# Patient Record
Sex: Male | Born: 1970 | Race: Black or African American | Hispanic: No | Marital: Single
Health system: Southern US, Community
[De-identification: ages and names within clinical notes are randomized; demographics above are authoritative.]

## PROBLEM LIST (undated history)

## (undated) DIAGNOSIS — R1013 Epigastric pain: Secondary | ICD-10-CM

## (undated) HISTORY — PX: OTHER SURGICAL HISTORY: SHX169

## (undated) HISTORY — DX: Epigastric pain: R10.13

---

## 2018-02-24 ENCOUNTER — Ambulatory Visit (INDEPENDENT_AMBULATORY_CARE_PROVIDER_SITE_OTHER): Payer: Self-pay | Admitting: Internal Medicine

## 2018-02-24 ENCOUNTER — Encounter (INDEPENDENT_AMBULATORY_CARE_PROVIDER_SITE_OTHER): Payer: Self-pay | Admitting: Internal Medicine

## 2018-02-24 ENCOUNTER — Encounter (INDEPENDENT_AMBULATORY_CARE_PROVIDER_SITE_OTHER): Payer: Self-pay | Admitting: *Deleted

## 2018-02-24 VITALS — BP 110/62 | HR 72 | Temp 98.1°F | Ht 73.0 in | Wt 155.6 lb

## 2018-02-24 DIAGNOSIS — R1013 Epigastric pain: Secondary | ICD-10-CM

## 2018-02-24 HISTORY — DX: Epigastric pain: R10.13

## 2018-02-24 MED ORDER — OMEPRAZOLE 40 MG PO CPDR: 40.0000 mg | DELAYED_RELEASE_CAPSULE | Freq: Every day | ORAL | 3 refills | Status: AC

## 2018-02-24 NOTE — Patient Instructions (Signed)
EGD/ US abdomen

## 2018-02-24 NOTE — Progress Notes (Signed)
   Subjective:    Patient ID: Isaiah SailorsLarry Solis, male    DOB: April 11, 1970, 47 y.o.   MRN: [id number]  [facility]. Seen back in September of this year for this. Pain worse when lying down. No vomiting, nausea, melena, diarrhea. Pain worse after eating. Given an Rx which sounds like Carafate to coat his stomach which he says helped. [facility] He says he continues to have the epigastric pain. He presently is not taking anything for his pain. Rates the pain 3/10. The pain is constant.  The pain does get worse after eating. No melena or BRRB. No prior hx of PUD.   Works at [employer].  12/20/2017 H. Pylori negative. H and H 12.5 and 36.7    Review of Systems Past Medical History:  Diagnosis Date  . Abdominal pain, epigastric 02/24/2018    History reviewed. No pertinent surgical history.  Not on File  No current outpatient medications on file prior to visit.   No current facility-administered medications on file prior to visit.         Objective:   Physical Exam Blood pressure 110/62, pulse 72, temperature 98.1 F (36.7 C), height 6\' 1"  (1.854 m), weight 155 lb 9.6 oz (70.6 kg). Alert and oriented. Skin warm and dry. Oral mucosa is moist.   . Sclera anicteric, conjunctivae is pink. Thyroid not enlarged. No cervical lymphadenopathy. Lungs clear. Heart regular rate and rhythm.  Abdomen is soft. Bowel sounds are positive. No hepatomegaly. No abdominal masses felt. No tenderness.  No edema to lower extremities.  .        Assessment & Plan:  Epigastric pain. Will rule out GERD. Will start him on Omeprazole 40mg  daily. Will schedule an EGD Samples of Dexilant given to patient.  US abdomen,.

## 2018-02-26 ENCOUNTER — Ambulatory Visit (HOSPITAL_COMMUNITY)
Admission: RE | Admit: 2018-02-26 | Discharge: 2018-02-26 | Disposition: A | Discharge status: Home / Self Care | Payer: Self-pay | Source: Ambulatory Visit | Attending: Internal Medicine | Admitting: Internal Medicine

## 2018-02-26 DIAGNOSIS — R1013 Epigastric pain: Secondary | ICD-10-CM | POA: Insufficient documentation

## 2018-04-28 ENCOUNTER — Encounter (HOSPITAL_COMMUNITY): Payer: Self-pay | Admitting: *Deleted

## 2018-04-28 ENCOUNTER — Other Ambulatory Visit: Payer: Self-pay

## 2018-04-28 ENCOUNTER — Ambulatory Visit (HOSPITAL_COMMUNITY)
Admission: RE | Admit: 2018-04-28 | Discharge: 2018-04-28 | Disposition: A | Discharge status: Home / Self Care | Payer: Self-pay | Attending: Internal Medicine | Admitting: Internal Medicine

## 2018-04-28 ENCOUNTER — Encounter (HOSPITAL_COMMUNITY)
Admission: RE | Disposition: A | Discharge status: Home / Self Care | Payer: Self-pay | Source: Home / Self Care | Attending: Internal Medicine

## 2018-04-28 ENCOUNTER — Telehealth (INDEPENDENT_AMBULATORY_CARE_PROVIDER_SITE_OTHER): Payer: Self-pay | Admitting: *Deleted

## 2018-04-28 DIAGNOSIS — Z538 Procedure and treatment not carried out for other reasons: Secondary | ICD-10-CM | POA: Insufficient documentation

## 2018-04-28 DIAGNOSIS — R1013 Epigastric pain: Secondary | ICD-10-CM | POA: Insufficient documentation

## 2018-04-28 SURGERY — EGD (ESOPHAGOGASTRODUODENOSCOPY)
Anesthesia: Moderate Sedation

## 2018-04-28 MED ORDER — MEPERIDINE HCL 50 MG/ML IJ SOLN
INTRAMUSCULAR | Status: AC
  Filled 2018-04-28: qty 1

## 2018-04-28 MED ORDER — MIDAZOLAM HCL 5 MG/5ML IJ SOLN
INTRAMUSCULAR | Status: AC
  Filled 2018-04-28: qty 10

## 2018-04-28 MED ORDER — LIDOCAINE VISCOUS HCL 2 % MT SOLN
OROMUCOSAL | Status: AC
  Filled 2018-04-28: qty 15

## 2018-04-28 MED ORDER — SODIUM CHLORIDE 0.9 % IV SOLN
INTRAVENOUS | Status: DC
  Administered 2018-04-28: 1000 mL via INTRAVENOUS

## 2018-04-28 NOTE — OR Nursing (Signed)
Patient in for an EGD. Patient told Isaiah Solis at the registration desk that he did not have anybody here with him. We informed the patient that we could not perform the procedure unless he had a person here in the hospital. Patient called several people and got his sister to come. When the sister arrived, she stated, "I don't even know why I am here nobody has explained anything to me. I don't like being in hospitals" Sister was taken to the patient's pre-op room. After about 5 minutes, the patient came out of the room and had taken his IV out and removed all electrodes and put on clothes and stated, "I just want to go. I don't want to have it done." Patients sister was upset stating, "I work and can't miss work, I thought I was just coming to pick him up." Explained to patient's sister that there has to be a responsible adult in the building when we perform the procedure due to sedation. Offered to apply dressing to IV site and patient refused, offered to have Dr. Karilyn Cota come and talk to patient and he refused and offered to get procedure rescheduled for a more convenient time and patient refused. Dr. Karilyn Cota and Isaiah Solis at the office notified of the above.

## 2018-04-28 NOTE — Telephone Encounter (Signed)
Per endo patient showed up to procedure with out a driver, they explained he needed driver and they couldn't do procedure until he had a driver, he proceeded to give the false phone numbers, again they explained he needed a driver, he finally got his sister to come, after they got him back, the sister began to fuss at him, he yanked out his IV came out of room dressed and stated he was leaving, he wasn't doing procedure and he wasn't reschduling

## 2018-04-29 NOTE — Telephone Encounter (Signed)
noted 

## 2020-10-26 ENCOUNTER — Inpatient Hospital Stay (HOSPITAL_COMMUNITY): Payer: Managed Care, Other (non HMO)

## 2020-10-26 ENCOUNTER — Emergency Department (HOSPITAL_COMMUNITY): Payer: Managed Care, Other (non HMO)

## 2020-10-26 ENCOUNTER — Inpatient Hospital Stay (HOSPITAL_COMMUNITY)
Admission: EM | Admit: 2020-10-26 | Discharge: 2020-11-21 | DRG: 004 | Disposition: A | Discharge status: Other Acute Inpatient Hospital | Payer: Managed Care, Other (non HMO) | Attending: Surgery | Admitting: Surgery

## 2020-10-26 ENCOUNTER — Other Ambulatory Visit: Payer: Self-pay

## 2020-10-26 DIAGNOSIS — S36039A Unspecified laceration of spleen, initial encounter: Secondary | ICD-10-CM | POA: Diagnosis present

## 2020-10-26 DIAGNOSIS — Z66 Do not resuscitate: Secondary | ICD-10-CM | POA: Diagnosis not present

## 2020-10-26 DIAGNOSIS — G9600 Cerebrospinal fluid leak, unspecified: Secondary | ICD-10-CM | POA: Diagnosis present

## 2020-10-26 DIAGNOSIS — S0285XA Fracture of orbit, unspecified, initial encounter for closed fracture: Secondary | ICD-10-CM | POA: Diagnosis present

## 2020-10-26 DIAGNOSIS — Z20822 Contact with and (suspected) exposure to covid-19: Secondary | ICD-10-CM | POA: Diagnosis present

## 2020-10-26 DIAGNOSIS — S069X1A Unspecified intracranial injury with loss of consciousness of 30 minutes or less, initial encounter: Secondary | ICD-10-CM | POA: Diagnosis not present

## 2020-10-26 DIAGNOSIS — J9811 Atelectasis: Secondary | ICD-10-CM | POA: Diagnosis not present

## 2020-10-26 DIAGNOSIS — J95811 Postprocedural pneumothorax: Secondary | ICD-10-CM

## 2020-10-26 DIAGNOSIS — J9690 Respiratory failure, unspecified, unspecified whether with hypoxia or hypercapnia: Secondary | ICD-10-CM | POA: Diagnosis present

## 2020-10-26 DIAGNOSIS — T1490XA Injury, unspecified, initial encounter: Secondary | ICD-10-CM | POA: Diagnosis present

## 2020-10-26 DIAGNOSIS — S06369A Traumatic hemorrhage of cerebrum, unspecified, with loss of consciousness of unspecified duration, initial encounter: Secondary | ICD-10-CM | POA: Diagnosis present

## 2020-10-26 DIAGNOSIS — H5704 Mydriasis: Secondary | ICD-10-CM | POA: Diagnosis present

## 2020-10-26 DIAGNOSIS — R6521 Severe sepsis with septic shock: Secondary | ICD-10-CM | POA: Diagnosis not present

## 2020-10-26 DIAGNOSIS — S270XXA Traumatic pneumothorax, initial encounter: Secondary | ICD-10-CM | POA: Diagnosis present

## 2020-10-26 DIAGNOSIS — Z7189 Other specified counseling: Secondary | ICD-10-CM

## 2020-10-26 DIAGNOSIS — S020XXB Fracture of vault of skull, initial encounter for open fracture: Secondary | ICD-10-CM | POA: Diagnosis present

## 2020-10-26 DIAGNOSIS — J9621 Acute and chronic respiratory failure with hypoxia: Secondary | ICD-10-CM | POA: Diagnosis not present

## 2020-10-26 DIAGNOSIS — G9389 Other specified disorders of brain: Secondary | ICD-10-CM | POA: Diagnosis present

## 2020-10-26 DIAGNOSIS — E870 Hyperosmolality and hypernatremia: Secondary | ICD-10-CM | POA: Diagnosis not present

## 2020-10-26 DIAGNOSIS — Z9689 Presence of other specified functional implants: Secondary | ICD-10-CM

## 2020-10-26 DIAGNOSIS — J189 Pneumonia, unspecified organism: Secondary | ICD-10-CM | POA: Diagnosis not present

## 2020-10-26 DIAGNOSIS — L8990 Pressure ulcer of unspecified site, unspecified stage: Secondary | ICD-10-CM | POA: Insufficient documentation

## 2020-10-26 DIAGNOSIS — Z930 Tracheostomy status: Secondary | ICD-10-CM

## 2020-10-26 DIAGNOSIS — Y92410 Unspecified street and highway as the place of occurrence of the external cause: Secondary | ICD-10-CM | POA: Diagnosis not present

## 2020-10-26 DIAGNOSIS — S12100A Unspecified displaced fracture of second cervical vertebra, initial encounter for closed fracture: Secondary | ICD-10-CM | POA: Diagnosis present

## 2020-10-26 DIAGNOSIS — Z515 Encounter for palliative care: Secondary | ICD-10-CM

## 2020-10-26 DIAGNOSIS — S40812A Abrasion of left upper arm, initial encounter: Secondary | ICD-10-CM | POA: Diagnosis present

## 2020-10-26 DIAGNOSIS — Z9911 Dependence on respirator [ventilator] status: Secondary | ICD-10-CM | POA: Diagnosis not present

## 2020-10-26 DIAGNOSIS — Z978 Presence of other specified devices: Secondary | ICD-10-CM

## 2020-10-26 DIAGNOSIS — I609 Nontraumatic subarachnoid hemorrhage, unspecified: Secondary | ICD-10-CM

## 2020-10-26 HISTORY — PX: IR US GUIDE VASC ACCESS RIGHT: IMG2390

## 2020-10-26 HISTORY — PX: IR ANGIOGRAM VISCERAL SELECTIVE: IMG657

## 2020-10-26 HISTORY — PX: IR EMBO ART  VEN HEMORR LYMPH EXTRAV  INC GUIDE ROADMAPPING: IMG5450

## 2020-10-26 LAB — URINALYSIS, ROUTINE W REFLEX MICROSCOPIC
Bacteria, UA: NONE SEEN
Bilirubin Urine: NEGATIVE
Bilirubin Urine: NEGATIVE
Glucose, UA: NEGATIVE mg/dL
Glucose, UA: NEGATIVE mg/dL
Ketones, ur: NEGATIVE mg/dL
Ketones, ur: NEGATIVE mg/dL
Leukocytes,Ua: NEGATIVE
Nitrite: NEGATIVE
Nitrite: NEGATIVE
Protein, ur: NEGATIVE mg/dL
Protein, ur: 30 mg/dL — AB
Specific Gravity, Urine: >1.046 — ABNORMAL HIGH (ref 1.005–1.030)
Specific Gravity, Urine: 1.028 (ref 1.005–1.030)
WBC, UA: >50 WBC/hpf — ABNORMAL HIGH (ref 0–5)
pH: 5 (ref 5.0–8.0)
pH: 5 (ref 5.0–8.0)

## 2020-10-26 LAB — I-STAT ARTERIAL BLOOD GAS, ED
Acid-base deficit: 1 mmol/L (ref 0.0–2.0)
Bicarbonate: 25 mmol/L (ref 20.0–28.0)
Calcium, Ion: 1.18 mmol/L (ref 1.15–1.40)
HCT: 27 % — ABNORMAL LOW (ref 39.0–52.0)
Hemoglobin: 9.2 g/dL — ABNORMAL LOW (ref 13.0–17.0)
O2 Saturation: 100 %
Patient temperature: 97.6
Potassium: 3 mmol/L — ABNORMAL LOW (ref 3.5–5.1)
Sodium: 140 mmol/L (ref 135–145)
TCO2: 26 mmol/L (ref 22–32)
pCO2 arterial: 45.8 mmHg (ref 32.0–48.0)
pH, Arterial: 7.342 — ABNORMAL LOW (ref 7.350–7.450)
pO2, Arterial: 342 mmHg — ABNORMAL HIGH (ref 83.0–108.0)

## 2020-10-26 LAB — BASIC METABOLIC PANEL
Anion gap: 11 (ref 5–15)
BUN: 13 mg/dL (ref 6–20)
CO2: 21 mmol/L — ABNORMAL LOW (ref 22–32)
Calcium: 8.6 mg/dL — ABNORMAL LOW (ref 8.9–10.3)
Chloride: 107 mmol/L (ref 98–111)
Creatinine, Ser: 1.05 mg/dL (ref 0.61–1.24)
GFR, Estimated: >60 mL/min (ref >60)
Glucose, Bld: 119 mg/dL — ABNORMAL HIGH (ref 70–99)
Potassium: 3.5 mmol/L (ref 3.5–5.1)
Sodium: 139 mmol/L (ref 135–145)

## 2020-10-26 LAB — COMPREHENSIVE METABOLIC PANEL
ALT: 24 U/L (ref 0–44)
AST: 47 U/L — ABNORMAL HIGH (ref 15–41)
Albumin: 3.4 g/dL — ABNORMAL LOW (ref 3.5–5.0)
Alkaline Phosphatase: 56 U/L (ref 38–126)
Anion gap: 9 (ref 5–15)
BUN: 14 mg/dL (ref 6–20)
CO2: 21 mmol/L — ABNORMAL LOW (ref 22–32)
Calcium: 8.4 mg/dL — ABNORMAL LOW (ref 8.9–10.3)
Chloride: 108 mmol/L (ref 98–111)
Creatinine, Ser: 1.18 mg/dL (ref 0.61–1.24)
GFR, Estimated: 42 mL/min — ABNORMAL LOW (ref >60)
Glucose, Bld: 161 mg/dL — ABNORMAL HIGH (ref 70–99)
Potassium: 3.2 mmol/L — ABNORMAL LOW (ref 3.5–5.1)
Sodium: 138 mmol/L (ref 135–145)
Total Bilirubin: 0.6 mg/dL (ref 0.3–1.2)
Total Protein: 5.8 g/dL — ABNORMAL LOW (ref 6.5–8.1)

## 2020-10-26 LAB — CBC
HCT: 29.1 % — ABNORMAL LOW (ref 39.0–52.0)
HCT: 31.1 % — ABNORMAL LOW (ref 39.0–52.0)
HCT: 32.1 % — ABNORMAL LOW (ref 39.0–52.0)
HCT: 32.7 % — ABNORMAL LOW (ref 39.0–52.0)
Hemoglobin: 10.1 g/dL — ABNORMAL LOW (ref 13.0–17.0)
Hemoglobin: 10.3 g/dL — ABNORMAL LOW (ref 13.0–17.0)
Hemoglobin: 10.6 g/dL — ABNORMAL LOW (ref 13.0–17.0)
Hemoglobin: 9.8 g/dL — ABNORMAL LOW (ref 13.0–17.0)
MCH: 27.4 pg (ref 26.0–34.0)
MCH: 28.1 pg (ref 26.0–34.0)
MCH: 28.1 pg (ref 26.0–34.0)
MCH: 28.2 pg (ref 26.0–34.0)
MCHC: 31.5 g/dL (ref 30.0–36.0)
MCHC: 32.4 g/dL (ref 30.0–36.0)
MCHC: 33.1 g/dL (ref 30.0–36.0)
MCHC: 33.7 g/dL (ref 30.0–36.0)
MCV: 83.6 fL (ref 80.0–100.0)
MCV: 85 fL (ref 80.0–100.0)
MCV: 86.7 fL (ref 80.0–100.0)
MCV: 87.2 fL (ref 80.0–100.0)
Platelets: 194 10*3/uL (ref 150–400)
Platelets: 229 10*3/uL (ref 150–400)
Platelets: 234 10*3/uL (ref 150–400)
Platelets: 260 10*3/uL (ref 150–400)
RBC: 3.48 MIL/uL — ABNORMAL LOW (ref 4.22–5.81)
RBC: 3.66 MIL/uL — ABNORMAL LOW (ref 4.22–5.81)
RBC: 3.68 MIL/uL — ABNORMAL LOW (ref 4.22–5.81)
RBC: 3.77 MIL/uL — ABNORMAL LOW (ref 4.22–5.81)
RDW: 13.9 % (ref 11.5–15.5)
RDW: 14.1 % (ref 11.5–15.5)
RDW: 14.2 % (ref 11.5–15.5)
RDW: 14.2 % (ref 11.5–15.5)
WBC: 16.2 10*3/uL — ABNORMAL HIGH (ref 4.0–10.5)
WBC: 17.2 10*3/uL — ABNORMAL HIGH (ref 4.0–10.5)
WBC: 19.9 10*3/uL — ABNORMAL HIGH (ref 4.0–10.5)
WBC: 6.2 10*3/uL (ref 4.0–10.5)
nRBC: 0 % (ref 0.0–0.2)
nRBC: 0 % (ref 0.0–0.2)
nRBC: 0 % (ref 0.0–0.2)
nRBC: 0 % (ref 0.0–0.2)

## 2020-10-26 LAB — I-STAT CHEM 8, ED
BUN: 14 mg/dL (ref 6–20)
Calcium, Ion: 1.06 mmol/L — ABNORMAL LOW (ref 1.15–1.40)
Chloride: 106 mmol/L (ref 98–111)
Creatinine, Ser: 1.3 mg/dL — ABNORMAL HIGH (ref 0.61–1.24)
Glucose, Bld: 157 mg/dL — ABNORMAL HIGH (ref 70–99)
HCT: 32 % — ABNORMAL LOW (ref 39.0–52.0)
Hemoglobin: 10.9 g/dL — ABNORMAL LOW (ref 13.0–17.0)
Potassium: 3.2 mmol/L — ABNORMAL LOW (ref 3.5–5.1)
Sodium: 141 mmol/L (ref 135–145)
TCO2: 22 mmol/L (ref 22–32)

## 2020-10-26 LAB — SAMPLE TO BLOOD BANK

## 2020-10-26 LAB — PROTIME-INR
INR: 1.2 (ref 0.8–1.2)
Prothrombin Time: 15.1 seconds (ref 11.4–15.2)

## 2020-10-26 LAB — SODIUM
Sodium: 140 mmol/L (ref 135–145)
Sodium: 141 mmol/L (ref 135–145)

## 2020-10-26 LAB — LACTIC ACID, PLASMA: Lactic Acid, Venous: 2.7 mmol/L (ref 0.5–1.9)

## 2020-10-26 LAB — TRAUMA TEG PANEL
CFF Max Amplitude: 21.9 mm (ref 15–32)
Citrated Kaolin (R): 4.7 min (ref 4.6–9.1)
Citrated Rapid TEG (MA): 61.4 mm (ref 52–70)
Lysis at 30 Minutes: 0.6 % (ref 0.0–2.6)

## 2020-10-26 LAB — ETHANOL: Alcohol, Ethyl (B): 171 mg/dL — ABNORMAL HIGH (ref <10)

## 2020-10-26 LAB — HIV ANTIBODY (ROUTINE TESTING W REFLEX): HIV Screen 4th Generation wRfx: NONREACTIVE

## 2020-10-26 LAB — RESP PANEL BY RT-PCR (FLU A&B, COVID) ARPGX2
Influenza A by PCR: NEGATIVE
Influenza B by PCR: NEGATIVE
SARS Coronavirus 2 by RT PCR: NEGATIVE

## 2020-10-26 MED ORDER — SODIUM CHLORIDE 0.9 % IV SOLN
INTRAVENOUS | Status: DC | PRN
  Administered 2020-10-26 – 2020-11-03 (×2): 1000 mL via INTRAVENOUS
  Administered 2020-11-07: 500 mL via INTRAVENOUS

## 2020-10-26 MED ORDER — FENTANYL CITRATE (PF) 100 MCG/2ML IJ SOLN
50.0000 ug | INTRAMUSCULAR | Status: DC | PRN
  Administered 2020-10-27 – 2020-10-29 (×8): 100 ug via INTRAVENOUS
  Administered 2020-10-29: 200 ug via INTRAVENOUS
  Administered 2020-10-30: 100 ug via INTRAVENOUS
  Administered 2020-10-30: 200 ug via INTRAVENOUS
  Administered 2020-10-30: 100 ug via INTRAVENOUS
  Administered 2020-10-30: 200 ug via INTRAVENOUS
  Administered 2020-10-30 – 2020-10-31 (×3): 100 ug via INTRAVENOUS
  Administered 2020-10-31: 50 ug via INTRAVENOUS
  Administered 2020-10-31 – 2020-11-01 (×3): 100 ug via INTRAVENOUS
  Administered 2020-11-01: 50 ug via INTRAVENOUS
  Administered 2020-11-01 – 2020-11-05 (×22): 100 ug via INTRAVENOUS
  Administered 2020-11-05: 200 ug via INTRAVENOUS
  Administered 2020-11-05: 100 ug via INTRAVENOUS
  Administered 2020-11-05: 50 ug via INTRAVENOUS
  Administered 2020-11-05: 100 ug via INTRAVENOUS
  Administered 2020-11-05 – 2020-11-08 (×5): 200 ug via INTRAVENOUS
  Administered 2020-11-08 – 2020-11-09 (×3): 100 ug via INTRAVENOUS
  Administered 2020-11-09: 200 ug via INTRAVENOUS
  Administered 2020-11-09 – 2020-11-12 (×9): 100 ug via INTRAVENOUS
  Administered 2020-11-12: 50 ug via INTRAVENOUS
  Administered 2020-11-12 – 2020-11-18 (×8): 100 ug via INTRAVENOUS
  Filled 2020-10-26: qty 2
  Filled 2020-10-26: qty 4
  Filled 2020-10-26: qty 2
  Filled 2020-10-26: qty 4
  Filled 2020-10-26 (×11): qty 2
  Filled 2020-10-26: qty 4
  Filled 2020-10-26 (×3): qty 2
  Filled 2020-10-26: qty 4
  Filled 2020-10-26 (×13): qty 2
  Filled 2020-10-26: qty 4
  Filled 2020-10-26 (×9): qty 2
  Filled 2020-10-26: qty 4
  Filled 2020-10-26 (×8): qty 2
  Filled 2020-10-26: qty 4
  Filled 2020-10-26 (×7): qty 2
  Filled 2020-10-26: qty 4
  Filled 2020-10-26 (×11): qty 2
  Filled 2020-10-26: qty 4
  Filled 2020-10-26: qty 2

## 2020-10-26 MED ORDER — ACETAMINOPHEN 160 MG/5ML PO SOLN: 650.0000 mg | Freq: Four times a day (QID) | ORAL | Status: DC | PRN

## 2020-10-26 MED ORDER — SODIUM CHLORIDE 0.9 % IV SOLN
2.0000 g | Freq: Two times a day (BID) | INTRAVENOUS | Status: DC
  Administered 2020-10-26 – 2020-11-01 (×14): 2 g via INTRAVENOUS
  Filled 2020-10-26: qty 2
  Filled 2020-10-26: qty 20
  Filled 2020-10-26 (×11): qty 2
  Filled 2020-10-26: qty 20
  Filled 2020-10-26: qty 2

## 2020-10-26 MED ORDER — ONDANSETRON 4 MG PO TBDP: 4.0000 mg | ORAL_TABLET | Freq: Four times a day (QID) | ORAL | Status: DC | PRN

## 2020-10-26 MED ORDER — LIDOCAINE HCL (PF) 1 % IJ SOLN
INTRAMUSCULAR | Status: AC
  Filled 2020-10-26: qty 30

## 2020-10-26 MED ORDER — IOHEXOL 300 MG/ML  SOLN
50.0000 mL | Freq: Once | INTRAMUSCULAR | Status: AC | PRN
  Administered 2020-10-26: 20 mL via INTRA_ARTERIAL

## 2020-10-26 MED ORDER — VANCOMYCIN HCL 1250 MG/250ML IV SOLN
1250.0000 mg | Freq: Once | INTRAVENOUS | Status: AC
  Administered 2020-10-26: 1250 mg via INTRAVENOUS
  Filled 2020-10-26: qty 250

## 2020-10-26 MED ORDER — LACTATED RINGERS IV SOLN: INTRAVENOUS | Status: DC

## 2020-10-26 MED ORDER — CHLORHEXIDINE GLUCONATE 0.12% ORAL RINSE (MEDLINE KIT)
15.0000 mL | Freq: Two times a day (BID) | OROMUCOSAL | Status: DC
  Administered 2020-10-26 – 2020-11-21 (×53): 15 mL via OROMUCOSAL

## 2020-10-26 MED ORDER — ONDANSETRON HCL 4 MG/2ML IJ SOLN: 4.0000 mg | Freq: Four times a day (QID) | INTRAMUSCULAR | Status: DC | PRN

## 2020-10-26 MED ORDER — IOHEXOL 350 MG/ML SOLN
100.0000 mL | Freq: Once | INTRAVENOUS | Status: AC | PRN
  Administered 2020-10-26: 100 mL via INTRAVENOUS

## 2020-10-26 MED ORDER — SODIUM CHLORIDE 3 % IV SOLN
INTRAVENOUS | Status: DC
  Filled 2020-10-26 (×3): qty 500

## 2020-10-26 MED ORDER — CHLORHEXIDINE GLUCONATE CLOTH 2 % EX PADS
6.0000 | MEDICATED_PAD | Freq: Every day | CUTANEOUS | Status: DC
  Administered 2020-10-26 – 2020-11-20 (×26): 6 via TOPICAL

## 2020-10-26 MED ORDER — MIDAZOLAM HCL 2 MG/2ML IJ SOLN
INTRAMUSCULAR | Status: AC
  Filled 2020-10-26: qty 4

## 2020-10-26 MED ORDER — DOCUSATE SODIUM 100 MG PO CAPS: 100.0000 mg | ORAL_CAPSULE | Freq: Two times a day (BID) | ORAL | Status: DC

## 2020-10-26 MED ORDER — MIDAZOLAM HCL 2 MG/2ML IJ SOLN: 2.0000 mg | INTRAMUSCULAR | Status: DC | PRN

## 2020-10-26 MED ORDER — FENTANYL CITRATE (PF) 100 MCG/2ML IJ SOLN
50.0000 ug | INTRAMUSCULAR | Status: DC | PRN
  Administered 2020-11-01 – 2020-11-09 (×2): 50 ug via INTRAVENOUS
  Filled 2020-10-26 (×2): qty 2

## 2020-10-26 MED ORDER — IOHEXOL 300 MG/ML  SOLN: 50.0000 mL | Freq: Once | INTRAMUSCULAR | Status: DC | PRN

## 2020-10-26 MED ORDER — ORAL CARE MOUTH RINSE
15.0000 mL | OROMUCOSAL | Status: DC
  Administered 2020-10-26 – 2020-11-21 (×264): 15 mL via OROMUCOSAL

## 2020-10-26 MED ORDER — HYDRALAZINE HCL 20 MG/ML IJ SOLN: 10.0000 mg | INTRAMUSCULAR | Status: DC | PRN

## 2020-10-26 MED ORDER — DOCUSATE SODIUM 50 MG/5ML PO LIQD
100.0000 mg | Freq: Two times a day (BID) | ORAL | Status: DC
  Administered 2020-10-26 – 2020-11-21 (×30): 100 mg
  Filled 2020-10-26 (×31): qty 10

## 2020-10-26 MED ORDER — PANTOPRAZOLE SODIUM 40 MG IV SOLR
40.0000 mg | Freq: Every day | INTRAVENOUS | Status: DC
Start: 2020-10-26 — End: 2020-11-06
  Administered 2020-10-26 – 2020-11-06 (×12): 40 mg via INTRAVENOUS
  Filled 2020-10-26 (×12): qty 40

## 2020-10-26 MED ORDER — FENTANYL CITRATE (PF) 100 MCG/2ML IJ SOLN
INTRAMUSCULAR | Status: AC
  Filled 2020-10-26: qty 4

## 2020-10-26 MED ORDER — ACETAMINOPHEN 500 MG PO TABS
1000.0000 mg | ORAL_TABLET | Freq: Four times a day (QID) | ORAL | Status: DC
  Administered 2020-10-26 – 2020-11-21 (×95): 1000 mg
  Filled 2020-10-26 (×100): qty 2

## 2020-10-26 MED ORDER — VANCOMYCIN HCL 750 MG/150ML IV SOLN
750.0000 mg | Freq: Two times a day (BID) | INTRAVENOUS | Status: DC
  Administered 2020-10-26 – 2020-10-30 (×8): 750 mg via INTRAVENOUS
  Filled 2020-10-26 (×8): qty 150

## 2020-10-26 MED ORDER — BISACODYL 10 MG RE SUPP: 10.0000 mg | Freq: Every day | RECTAL | Status: DC | PRN

## 2020-10-26 MED ORDER — IOHEXOL 300 MG/ML  SOLN
50.0000 mL | Freq: Once | INTRAMUSCULAR | Status: AC | PRN
  Administered 2020-10-26: 25 mL via INTRA_ARTERIAL

## 2020-10-26 MED ORDER — LEVETIRACETAM IN NACL 500 MG/100ML IV SOLN
500.0000 mg | Freq: Two times a day (BID) | INTRAVENOUS | Status: DC
  Administered 2020-10-26 – 2020-11-06 (×23): 500 mg via INTRAVENOUS
  Filled 2020-10-26 (×23): qty 100

## 2020-10-26 MED ORDER — POLYETHYLENE GLYCOL 3350 17 G PO PACK
17.0000 g | PACK | Freq: Every day | ORAL | Status: DC
  Administered 2020-10-26 – 2020-11-21 (×14): 17 g
  Filled 2020-10-26 (×14): qty 1

## 2020-10-26 MED ORDER — PANTOPRAZOLE SODIUM 40 MG PO TBEC: 40.0000 mg | DELAYED_RELEASE_TABLET | Freq: Every day | ORAL | Status: DC

## 2020-10-26 NOTE — Progress Notes (Signed)
EEG complete - results pending 
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
## 2020-10-26 NOTE — Progress Notes (Signed)
Patient seen and examined. Remains off continuous analgesia/sedation and has not received andy PRN doses of either. No response to painful stimulus for me. Anisocoria L>R and L nonreactive. Admission CT imaging reviewed. EEG ordered, no sz activity. Repeat CT head this AM with slightly increased MLS, and again on repeat CT this PM. Patient now febrile, suspect early onset of neurostorming. Tylenol ordered, pancx sent. D/w NSGY, Dr. Jake Samples, and plan to assess neuro status over the next 24h. Clinical update provided to three of the patient's four daughters and one of two sisters via phone. Discussion held jointly with Palliative PA, Lamarr Lulas. Clarified that the patient is unmarried and all four children are over the age of 29. Explained decision-making rights per [place] law and all verbalized understanding. Communicated patient's extremely guarded prognosis and suspicion for significant brain injury that would result in  most likely scenario of need for care in a facility for needs and vent dependency long-term. Discussed plan per NSGY to assess for improvement over the next 24h and will re-convene with all family 8/7 at 1400 for another discussion.   Critical care time:  Diamantina Monks, MD General and Trauma Surgery [facility] Surgery
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
## 2020-10-26 NOTE — ED Notes (Signed)
Back to traumaB at this time.

## 2020-10-26 NOTE — TOC CAGE-AID Note (Signed)
Transition of Care [facility]) - CAGE-AID Screening   Patient Details  Name: Isaiah Solis MRN: [id number] Date of Birth: 05-11-1970   Hewitt Shorts, RN Trauma Response Nurse Phone Number: [phone] 10/26/2020, 7:30 AM   Clinical Narrative:  Pt currently is intubated.    CAGE-AID Screening: Substance Abuse Screening unable to be completed due to: : Patient unable to participate (pt currently is intubated in ICU)
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
## 2020-10-26 NOTE — Progress Notes (Signed)
RT transported pt to CT3 and back to 4N20 w/ no complications. RT will cont to monitor.
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
## 2020-10-27 LAB — BASIC METABOLIC PANEL
Anion gap: 6 (ref 5–15)
BUN: 17 mg/dL (ref 6–20)
CO2: 25 mmol/L (ref 22–32)
Calcium: 8.7 mg/dL — ABNORMAL LOW (ref 8.9–10.3)
Chloride: 111 mmol/L (ref 98–111)
Creatinine, Ser: 0.81 mg/dL (ref 0.61–1.24)
GFR, Estimated: >60 mL/min (ref >60)
Glucose, Bld: 138 mg/dL — ABNORMAL HIGH (ref 70–99)
Potassium: 3.7 mmol/L (ref 3.5–5.1)
Sodium: 142 mmol/L (ref 135–145)

## 2020-10-27 LAB — SODIUM
Sodium: 143 mmol/L (ref 135–145)
Sodium: 148 mmol/L — ABNORMAL HIGH (ref 135–145)

## 2020-10-27 MED ORDER — SODIUM CHLORIDE 3 % IV SOLN
INTRAVENOUS | Status: DC
  Filled 2020-10-27 (×3): qty 500
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
## 2020-10-28 DIAGNOSIS — S069X6A Unspecified intracranial injury with loss of consciousness greater than 24 hours without return to pre-existing conscious level with patient surviving, initial encounter: Secondary | ICD-10-CM

## 2020-10-28 LAB — BASIC METABOLIC PANEL
Anion gap: 6 (ref 5–15)
BUN: 19 mg/dL (ref 6–20)
CO2: 23 mmol/L (ref 22–32)
Calcium: 8.4 mg/dL — ABNORMAL LOW (ref 8.9–10.3)
Chloride: 121 mmol/L — ABNORMAL HIGH (ref 98–111)
Creatinine, Ser: 0.85 mg/dL (ref 0.61–1.24)
GFR, Estimated: >60 mL/min (ref >60)
Glucose, Bld: 115 mg/dL — ABNORMAL HIGH (ref 70–99)
Potassium: 3.5 mmol/L (ref 3.5–5.1)
Sodium: 150 mmol/L — ABNORMAL HIGH (ref 135–145)

## 2020-10-28 LAB — SODIUM
Sodium: 152 mmol/L — ABNORMAL HIGH (ref 135–145)
Sodium: 152 mmol/L — ABNORMAL HIGH (ref 135–145)

## 2020-10-28 NOTE — Progress Notes (Signed)
Na+ 150 from AM labs. Dr. Maurice Small notified and discontinued 3%.
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
## 2020-10-28 NOTE — Progress Notes (Signed)
   10/28/20 1505  Clinical Encounter Type  Visited With Patient and family together  Visit Type Spiritual support  Referral From Nurse  Consult/Referral To Chaplain  Spiritual Encounters  Spiritual Needs Emotional  Chaplain received a page that the family for Mr. Isaiah Solis received some critical news on his prognosis.  Introduced myself to the family and informed them I am here for support and any way I can help please let myself and the nurse know.  Prayer was not requested at this time.
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
## 2020-10-29 ENCOUNTER — Inpatient Hospital Stay (HOSPITAL_COMMUNITY): Payer: Managed Care, Other (non HMO)

## 2020-10-29 DIAGNOSIS — Z66 Do not resuscitate: Secondary | ICD-10-CM

## 2020-10-29 DIAGNOSIS — S069X9A Unspecified intracranial injury with loss of consciousness of unspecified duration, initial encounter: Secondary | ICD-10-CM

## 2020-10-29 LAB — CULTURE, RESPIRATORY W GRAM STAIN: Culture: NORMAL

## 2020-10-29 LAB — SODIUM
Sodium: 149 mmol/L — ABNORMAL HIGH (ref 135–145)
Sodium: 152 mmol/L — ABNORMAL HIGH (ref 135–145)
Sodium: 151 mmol/L — ABNORMAL HIGH (ref 135–145)
Sodium: 151 mmol/L — ABNORMAL HIGH (ref 135–145)

## 2020-10-29 LAB — MRSA NEXT GEN BY PCR, NASAL: MRSA by PCR Next Gen: NOT DETECTED

## 2020-10-29 LAB — GLUCOSE, CAPILLARY
Glucose-Capillary: 95 mg/dL (ref 70–99)
Glucose-Capillary: 99 mg/dL (ref 70–99)

## 2020-10-29 MED ORDER — PROSOURCE TF PO LIQD: 45.0000 mL | Freq: Two times a day (BID) | ORAL | Status: DC

## 2020-10-29 MED ORDER — PIVOT 1.5 CAL PO LIQD
1000.0000 mL | ORAL | Status: DC
  Administered 2020-10-29 – 2020-11-04 (×7): 1000 mL
  Filled 2020-10-29: qty 1000

## 2020-10-29 MED ORDER — VITAL HIGH PROTEIN PO LIQD: 1000.0000 mL | ORAL | Status: DC

## 2020-10-29 NOTE — Progress Notes (Signed)
This chaplain is responding to PMT consult for prayer. This chaplain is appreciative of Chaplain Vincella's prayer on Sunday.    The chaplain understands from communicating with the Pt. RN-Susannah, family has not been present bedside today.  The chaplain offered F/U spiritual care as needed through the spiritual care team and PMT, [phone].  The chaplain will continue intercessory prayer.

## 2020-10-29 NOTE — Progress Notes (Signed)
MRI brain reviewed. See report below COMPARISON:  Head CT October 26, 2020  FINDINGS: Brain: Intraparenchymal hemorrhage centered in the right basal ganglia with surrounding vasogenic edema measuring approximately 2.7 x 2.6 cm causing mass effect on the right lateral ventricle with face 6 mm leftward midline shift. Confluent foci of intraparenchymal hemorrhage in the posterior right temporal lobe with surrounding vasogenic edema extending into the right occipital lobe. The largest focus measures approximately 3.0 x 1.9 cm. The vasogenic edema surrounding the intraparenchymal hemorrhages appear progressed from prior CT. Intraventricular hemorrhage seen layering the occipital horn of the left lateral ventricle and small foci of subarachnoid hemorrhage are seen scattered in the bilateral cerebral convexity. No hydrocephalus. Foci of restricted diffusion are seen in the right corona radiata, splenium of the corpus callosum, scattered in the bilateral frontal cortex and in the midbrain. Foci of susceptibility artifact are seen in the deep white matter of the left frontal lobe and along the left fornix. Vascular: Normal flow voids. Skull and upper cervical spine: Left frontal orbital fracture. For details on skull fractures, please refer to prior head CT. Sinuses/Orbits: Fluid level within the paranasal sinuses and nasopharynx likely related to the presence of endotracheal tube. A a the orbits are maintained. Other: None. IMPRESSION: 1. Right basal ganglia intraparenchymal hemorrhage and confluent foci of right temporal intraparenchymal hemorrhages with progressed surrounding vasogenic edema resulting approximately 6 mm leftward midline shift. 2. Findings consistent with diffuse axonal injury with susceptibility artifacts and foci of restricted diffusion within the bilateral frontal lobes, splenium of corpus callosum, left fornix and midbrain, as detailed above.   Given these findings,  I called back Ms. Arlee Muslim (daughter), along with the patient's bedside RN on the speaker phone call-and expained the imaging findings.  The MRI confirms the CT findings as well as shows additional evidence of shearing injury as seen in TBI and worsening intraparenchymal hemorrhages.  Crux of the conversation: Given the exam as documented by Dr. Amada Jupiter and by neurosurgery as well as imaging findings, chances of neurologically meaningful recovery for independent living are very poor. Daughter expressed understanding.  I gave her the opportunity of asking me questions which she did and answered them to the best of my ability.  Please call neurology with any further questions if we can be of assistance.  -- Milon Dikes, MD Neurologist [facility] Pager: [phone]
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
## 2020-10-29 NOTE — Progress Notes (Signed)
ETT holder changed. 

## 2020-10-29 NOTE — Progress Notes (Signed)
Pt transported to MRI and back to 4N 20 on full vent support. No complications noted.

## 2020-10-30 LAB — GLUCOSE, CAPILLARY
Glucose-Capillary: 102 mg/dL — ABNORMAL HIGH (ref 70–99)
Glucose-Capillary: 116 mg/dL — ABNORMAL HIGH (ref 70–99)
Glucose-Capillary: 123 mg/dL — ABNORMAL HIGH (ref 70–99)
Glucose-Capillary: 142 mg/dL — ABNORMAL HIGH (ref 70–99)
Glucose-Capillary: 143 mg/dL — ABNORMAL HIGH (ref 70–99)
Glucose-Capillary: 162 mg/dL — ABNORMAL HIGH (ref 70–99)

## 2020-10-30 LAB — VANCOMYCIN, TROUGH: Vancomycin Tr: 6 ug/mL — ABNORMAL LOW (ref 15–20)

## 2020-10-30 LAB — SODIUM
Sodium: 149 mmol/L — ABNORMAL HIGH (ref 135–145)
Sodium: 151 mmol/L — ABNORMAL HIGH (ref 135–145)
Sodium: 149 mmol/L — ABNORMAL HIGH (ref 135–145)
Sodium: 149 mmol/L — ABNORMAL HIGH (ref 135–145)

## 2020-10-30 MED ORDER — VANCOMYCIN HCL 1250 MG/250ML IV SOLN
1250.0000 mg | Freq: Three times a day (TID) | INTRAVENOUS | Status: DC
  Administered 2020-10-30 – 2020-11-01 (×7): 1250 mg via INTRAVENOUS
  Filled 2020-10-30 (×8): qty 250
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
## 2020-10-31 ENCOUNTER — Inpatient Hospital Stay (HOSPITAL_COMMUNITY): Payer: Managed Care, Other (non HMO)

## 2020-10-31 DIAGNOSIS — J9620 Acute and chronic respiratory failure, unspecified whether with hypoxia or hypercapnia: Secondary | ICD-10-CM

## 2020-10-31 LAB — CULTURE, BLOOD (ROUTINE X 2)
Culture: NO GROWTH
Culture: NO GROWTH
Special Requests: ADEQUATE
Special Requests: ADEQUATE

## 2020-10-31 LAB — GLUCOSE, CAPILLARY
Glucose-Capillary: 140 mg/dL — ABNORMAL HIGH (ref 70–99)
Glucose-Capillary: 145 mg/dL — ABNORMAL HIGH (ref 70–99)
Glucose-Capillary: 141 mg/dL — ABNORMAL HIGH (ref 70–99)
Glucose-Capillary: 139 mg/dL — ABNORMAL HIGH (ref 70–99)
Glucose-Capillary: 143 mg/dL — ABNORMAL HIGH (ref 70–99)
Glucose-Capillary: 135 mg/dL — ABNORMAL HIGH (ref 70–99)

## 2020-10-31 LAB — BASIC METABOLIC PANEL
Anion gap: 4 — ABNORMAL LOW (ref 5–15)
BUN: 16 mg/dL (ref 6–20)
CO2: 27 mmol/L (ref 22–32)
Calcium: 8.5 mg/dL — ABNORMAL LOW (ref 8.9–10.3)
Chloride: 118 mmol/L — ABNORMAL HIGH (ref 98–111)
Creatinine, Ser: 0.78 mg/dL (ref 0.61–1.24)
GFR, Estimated: >60 mL/min (ref >60)
Glucose, Bld: 135 mg/dL — ABNORMAL HIGH (ref 70–99)
Potassium: 3.4 mmol/L — ABNORMAL LOW (ref 3.5–5.1)
Sodium: 149 mmol/L — ABNORMAL HIGH (ref 135–145)

## 2020-10-31 LAB — CBC
HCT: 27.6 % — ABNORMAL LOW (ref 39.0–52.0)
Hemoglobin: 8.8 g/dL — ABNORMAL LOW (ref 13.0–17.0)
MCH: 27.8 pg (ref 26.0–34.0)
MCHC: 31.9 g/dL (ref 30.0–36.0)
MCV: 87.3 fL (ref 80.0–100.0)
Platelets: 228 10*3/uL (ref 150–400)
RBC: 3.16 MIL/uL — ABNORMAL LOW (ref 4.22–5.81)
RDW: 14.6 % (ref 11.5–15.5)
WBC: 10.8 10*3/uL — ABNORMAL HIGH (ref 4.0–10.5)
nRBC: 0 % (ref 0.0–0.2)

## 2020-10-31 LAB — SODIUM: Sodium: 149 mmol/L — ABNORMAL HIGH (ref 135–145)

## 2020-10-31 MED ORDER — FUROSEMIDE 10 MG/ML IJ SOLN
40.0000 mg | Freq: Once | INTRAMUSCULAR | Status: AC
  Administered 2020-10-31: 40 mg via INTRAVENOUS
  Filled 2020-10-31: qty 4

## 2020-10-31 MED ORDER — ENOXAPARIN SODIUM 30 MG/0.3ML IJ SOSY
30.0000 mg | PREFILLED_SYRINGE | Freq: Two times a day (BID) | INTRAMUSCULAR | Status: DC
  Administered 2020-10-31 – 2020-11-21 (×43): 30 mg via SUBCUTANEOUS
  Filled 2020-10-31 (×43): qty 0.3

## 2020-10-31 NOTE — Progress Notes (Signed)
Pt placed on PSV 10/5 by MD. Pt is tolerating well at this time. RN aware. RT to continue to monitor.
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
## 2020-10-31 NOTE — Progress Notes (Signed)
This chaplain attempted a spiritual care visit with the Pt. family.  The Pt. RN-Nick updated the chaplain on the Pt. and the family's communication.   This chaplain will review the Pt. chart and collaborate with the PMT for F/U spiritual care.

## 2020-10-31 NOTE — Progress Notes (Signed)
Pt placed back on full vent support due to increased WOB and agitation. RN at bedside.

## 2020-11-01 ENCOUNTER — Inpatient Hospital Stay (HOSPITAL_COMMUNITY): Payer: Managed Care, Other (non HMO)

## 2020-11-01 LAB — BASIC METABOLIC PANEL
Anion gap: 5 (ref 5–15)
BUN: 20 mg/dL (ref 6–20)
CO2: 30 mmol/L (ref 22–32)
Calcium: 8.4 mg/dL — ABNORMAL LOW (ref 8.9–10.3)
Chloride: 111 mmol/L (ref 98–111)
Creatinine, Ser: 0.75 mg/dL (ref 0.61–1.24)
GFR, Estimated: >60 mL/min (ref >60)
Glucose, Bld: 155 mg/dL — ABNORMAL HIGH (ref 70–99)
Potassium: 3.2 mmol/L — ABNORMAL LOW (ref 3.5–5.1)
Sodium: 146 mmol/L — ABNORMAL HIGH (ref 135–145)

## 2020-11-01 LAB — GLUCOSE, CAPILLARY
Glucose-Capillary: 146 mg/dL — ABNORMAL HIGH (ref 70–99)
Glucose-Capillary: 164 mg/dL — ABNORMAL HIGH (ref 70–99)
Glucose-Capillary: 138 mg/dL — ABNORMAL HIGH (ref 70–99)
Glucose-Capillary: 131 mg/dL — ABNORMAL HIGH (ref 70–99)
Glucose-Capillary: 126 mg/dL — ABNORMAL HIGH (ref 70–99)
Glucose-Capillary: 140 mg/dL — ABNORMAL HIGH (ref 70–99)

## 2020-11-01 LAB — CBC
HCT: 28.1 % — ABNORMAL LOW (ref 39.0–52.0)
Hemoglobin: 8.8 g/dL — ABNORMAL LOW (ref 13.0–17.0)
MCH: 27.5 pg (ref 26.0–34.0)
MCHC: 31.3 g/dL (ref 30.0–36.0)
MCV: 87.8 fL (ref 80.0–100.0)
Platelets: 255 10*3/uL (ref 150–400)
RBC: 3.2 MIL/uL — ABNORMAL LOW (ref 4.22–5.81)
RDW: 14.5 % (ref 11.5–15.5)
WBC: 10.6 10*3/uL — ABNORMAL HIGH (ref 4.0–10.5)
nRBC: 0 % (ref 0.0–0.2)

## 2020-11-01 LAB — VANCOMYCIN, TROUGH: Vancomycin Tr: 18 ug/mL (ref 15–20)

## 2020-11-01 MED ORDER — FUROSEMIDE 10 MG/ML IJ SOLN
40.0000 mg | Freq: Once | INTRAMUSCULAR | Status: AC
  Administered 2020-11-01: 40 mg via INTRAVENOUS
  Filled 2020-11-01: qty 4
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
## 2020-11-02 ENCOUNTER — Inpatient Hospital Stay (HOSPITAL_COMMUNITY): Payer: Managed Care, Other (non HMO)

## 2020-11-02 LAB — BASIC METABOLIC PANEL
Anion gap: 7 (ref 5–15)
BUN: 22 mg/dL — ABNORMAL HIGH (ref 6–20)
CO2: 29 mmol/L (ref 22–32)
Calcium: 8.8 mg/dL — ABNORMAL LOW (ref 8.9–10.3)
Chloride: 111 mmol/L (ref 98–111)
Creatinine, Ser: 0.7 mg/dL (ref 0.61–1.24)
GFR, Estimated: >60 mL/min (ref >60)
Glucose, Bld: 121 mg/dL — ABNORMAL HIGH (ref 70–99)
Potassium: 3.7 mmol/L (ref 3.5–5.1)
Sodium: 147 mmol/L — ABNORMAL HIGH (ref 135–145)

## 2020-11-02 LAB — GLUCOSE, CAPILLARY
Glucose-Capillary: 111 mg/dL — ABNORMAL HIGH (ref 70–99)
Glucose-Capillary: 118 mg/dL — ABNORMAL HIGH (ref 70–99)
Glucose-Capillary: 119 mg/dL — ABNORMAL HIGH (ref 70–99)
Glucose-Capillary: 119 mg/dL — ABNORMAL HIGH (ref 70–99)
Glucose-Capillary: 122 mg/dL — ABNORMAL HIGH (ref 70–99)
Glucose-Capillary: 136 mg/dL — ABNORMAL HIGH (ref 70–99)

## 2020-11-02 LAB — CBC
HCT: 30.3 % — ABNORMAL LOW (ref 39.0–52.0)
Hemoglobin: 9.8 g/dL — ABNORMAL LOW (ref 13.0–17.0)
MCH: 28.5 pg (ref 26.0–34.0)
MCHC: 32.3 g/dL (ref 30.0–36.0)
MCV: 88.1 fL (ref 80.0–100.0)
Platelets: 317 10*3/uL (ref 150–400)
RBC: 3.44 MIL/uL — ABNORMAL LOW (ref 4.22–5.81)
RDW: 14.6 % (ref 11.5–15.5)
WBC: 10.6 10*3/uL — ABNORMAL HIGH (ref 4.0–10.5)
nRBC: 0 % (ref 0.0–0.2)
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
## 2020-11-03 ENCOUNTER — Inpatient Hospital Stay (HOSPITAL_COMMUNITY): Payer: Managed Care, Other (non HMO)

## 2020-11-03 LAB — BASIC METABOLIC PANEL
Anion gap: 6 (ref 5–15)
BUN: 23 mg/dL — ABNORMAL HIGH (ref 6–20)
CO2: 29 mmol/L (ref 22–32)
Calcium: 8.8 mg/dL — ABNORMAL LOW (ref 8.9–10.3)
Chloride: 110 mmol/L (ref 98–111)
Creatinine, Ser: 0.67 mg/dL (ref 0.61–1.24)
GFR, Estimated: >60 mL/min (ref >60)
Glucose, Bld: 121 mg/dL — ABNORMAL HIGH (ref 70–99)
Potassium: 4 mmol/L (ref 3.5–5.1)
Sodium: 145 mmol/L (ref 135–145)

## 2020-11-03 LAB — GLUCOSE, CAPILLARY
Glucose-Capillary: 106 mg/dL — ABNORMAL HIGH (ref 70–99)
Glucose-Capillary: 111 mg/dL — ABNORMAL HIGH (ref 70–99)
Glucose-Capillary: 115 mg/dL — ABNORMAL HIGH (ref 70–99)
Glucose-Capillary: 116 mg/dL — ABNORMAL HIGH (ref 70–99)
Glucose-Capillary: 118 mg/dL — ABNORMAL HIGH (ref 70–99)
Glucose-Capillary: 143 mg/dL — ABNORMAL HIGH (ref 70–99)

## 2020-11-03 LAB — CBC
HCT: 29.4 % — ABNORMAL LOW (ref 39.0–52.0)
Hemoglobin: 9.1 g/dL — ABNORMAL LOW (ref 13.0–17.0)
MCH: 27.5 pg (ref 26.0–34.0)
MCHC: 31 g/dL (ref 30.0–36.0)
MCV: 88.8 fL (ref 80.0–100.0)
Platelets: 355 10*3/uL (ref 150–400)
RBC: 3.31 MIL/uL — ABNORMAL LOW (ref 4.22–5.81)
RDW: 14.6 % (ref 11.5–15.5)
WBC: 9.1 10*3/uL (ref 4.0–10.5)
nRBC: 0 % (ref 0.0–0.2)
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
## 2020-11-04 LAB — BASIC METABOLIC PANEL
Anion gap: 10 (ref 5–15)
BUN: 23 mg/dL — ABNORMAL HIGH (ref 6–20)
CO2: 27 mmol/L (ref 22–32)
Calcium: 9.3 mg/dL (ref 8.9–10.3)
Chloride: 109 mmol/L (ref 98–111)
Creatinine, Ser: 0.66 mg/dL (ref 0.61–1.24)
GFR, Estimated: >60 mL/min (ref >60)
Glucose, Bld: 110 mg/dL — ABNORMAL HIGH (ref 70–99)
Potassium: 4.5 mmol/L (ref 3.5–5.1)
Sodium: 146 mmol/L — ABNORMAL HIGH (ref 135–145)

## 2020-11-04 LAB — GLUCOSE, CAPILLARY
Glucose-Capillary: 105 mg/dL — ABNORMAL HIGH (ref 70–99)
Glucose-Capillary: 113 mg/dL — ABNORMAL HIGH (ref 70–99)
Glucose-Capillary: 126 mg/dL — ABNORMAL HIGH (ref 70–99)
Glucose-Capillary: 116 mg/dL — ABNORMAL HIGH (ref 70–99)
Glucose-Capillary: 117 mg/dL — ABNORMAL HIGH (ref 70–99)

## 2020-11-04 LAB — CBC
HCT: 32.3 % — ABNORMAL LOW (ref 39.0–52.0)
Hemoglobin: 10.2 g/dL — ABNORMAL LOW (ref 13.0–17.0)
MCH: 27.9 pg (ref 26.0–34.0)
MCHC: 31.6 g/dL (ref 30.0–36.0)
MCV: 88.5 fL (ref 80.0–100.0)
Platelets: 332 10*3/uL (ref 150–400)
RBC: 3.65 MIL/uL — ABNORMAL LOW (ref 4.22–5.81)
RDW: 14.3 % (ref 11.5–15.5)
WBC: 10.8 10*3/uL — ABNORMAL HIGH (ref 4.0–10.5)
nRBC: 0 % (ref 0.0–0.2)
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
## 2020-11-05 ENCOUNTER — Inpatient Hospital Stay (HOSPITAL_COMMUNITY): Payer: Managed Care, Other (non HMO)

## 2020-11-05 LAB — BASIC METABOLIC PANEL
Anion gap: 7 (ref 5–15)
BUN: 27 mg/dL — ABNORMAL HIGH (ref 6–20)
CO2: 30 mmol/L (ref 22–32)
Calcium: 9.5 mg/dL (ref 8.9–10.3)
Chloride: 107 mmol/L (ref 98–111)
Creatinine, Ser: 0.85 mg/dL (ref 0.61–1.24)
GFR, Estimated: >60 mL/min (ref >60)
Glucose, Bld: 121 mg/dL — ABNORMAL HIGH (ref 70–99)
Potassium: 4.6 mmol/L (ref 3.5–5.1)
Sodium: 144 mmol/L (ref 135–145)

## 2020-11-05 LAB — GLUCOSE, CAPILLARY
Glucose-Capillary: 101 mg/dL — ABNORMAL HIGH (ref 70–99)
Glucose-Capillary: 120 mg/dL — ABNORMAL HIGH (ref 70–99)
Glucose-Capillary: 121 mg/dL — ABNORMAL HIGH (ref 70–99)
Glucose-Capillary: 123 mg/dL — ABNORMAL HIGH (ref 70–99)
Glucose-Capillary: 127 mg/dL — ABNORMAL HIGH (ref 70–99)
Glucose-Capillary: 131 mg/dL — ABNORMAL HIGH (ref 70–99)
Glucose-Capillary: 134 mg/dL — ABNORMAL HIGH (ref 70–99)

## 2020-11-05 LAB — CBC
HCT: 34.9 % — ABNORMAL LOW (ref 39.0–52.0)
Hemoglobin: 10.9 g/dL — ABNORMAL LOW (ref 13.0–17.0)
MCH: 27.8 pg (ref 26.0–34.0)
MCHC: 31.2 g/dL (ref 30.0–36.0)
MCV: 89 fL (ref 80.0–100.0)
Platelets: 532 10*3/uL — ABNORMAL HIGH (ref 150–400)
RBC: 3.92 MIL/uL — ABNORMAL LOW (ref 4.22–5.81)
RDW: 14.2 % (ref 11.5–15.5)
WBC: 11.5 10*3/uL — ABNORMAL HIGH (ref 4.0–10.5)
nRBC: 0 % (ref 0.0–0.2)

## 2020-11-05 MED ORDER — JEVITY 1.2 CAL PO LIQD
1000.0000 mL | ORAL | Status: DC
  Administered 2020-11-05 – 2020-11-16 (×12): 1000 mL
  Filled 2020-11-05 (×21): qty 1000

## 2020-11-05 MED ORDER — PROSOURCE TF PO LIQD
45.0000 mL | Freq: Two times a day (BID) | ORAL | Status: DC
  Administered 2020-11-05 – 2020-11-12 (×15): 45 mL
  Filled 2020-11-05 (×15): qty 45
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
## 2020-11-05 NOTE — Progress Notes (Signed)
RT NOTE: patient placed back on full support ventilation due to increased agitation and coughing and RN needing to give relaxing medication.  Tolerating well at this time.  Will continue to monitor.

## 2020-11-05 NOTE — Progress Notes (Signed)
RT NOTE: patient placed on CPAP/PSV of 10/5 at 0747.  Currently tolerating well.  Will continue to monitor and wean as tolerated.
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
## 2020-11-06 DIAGNOSIS — Z9911 Dependence on respirator [ventilator] status: Secondary | ICD-10-CM

## 2020-11-06 LAB — GLUCOSE, CAPILLARY
Glucose-Capillary: 142 mg/dL — ABNORMAL HIGH (ref 70–99)
Glucose-Capillary: 127 mg/dL — ABNORMAL HIGH (ref 70–99)
Glucose-Capillary: 125 mg/dL — ABNORMAL HIGH (ref 70–99)
Glucose-Capillary: 124 mg/dL — ABNORMAL HIGH (ref 70–99)
Glucose-Capillary: 126 mg/dL — ABNORMAL HIGH (ref 70–99)
Glucose-Capillary: 150 mg/dL — ABNORMAL HIGH (ref 70–99)

## 2020-11-06 MED ORDER — PANTOPRAZOLE SODIUM 40 MG PO PACK
40.0000 mg | PACK | Freq: Every day | ORAL | Status: DC
  Administered 2020-11-07 – 2020-11-21 (×15): 40 mg
  Filled 2020-11-06 (×15): qty 20

## 2020-11-06 MED ORDER — LEVETIRACETAM 100 MG/ML PO SOLN
500.0000 mg | Freq: Two times a day (BID) | ORAL | Status: DC
  Administered 2020-11-06: 500 mg
  Filled 2020-11-06: qty 5

## 2020-11-06 MED ORDER — PROPRANOLOL HCL 20 MG PO TABS
20.0000 mg | ORAL_TABLET | Freq: Three times a day (TID) | ORAL | Status: DC
  Administered 2020-11-06 – 2020-11-21 (×47): 20 mg
  Filled 2020-11-06 (×47): qty 1
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
## 2020-11-07 ENCOUNTER — Inpatient Hospital Stay (HOSPITAL_COMMUNITY): Payer: Managed Care, Other (non HMO)

## 2020-11-07 DIAGNOSIS — L8990 Pressure ulcer of unspecified site, unspecified stage: Secondary | ICD-10-CM | POA: Insufficient documentation

## 2020-11-07 LAB — GLUCOSE, CAPILLARY
Glucose-Capillary: 129 mg/dL — ABNORMAL HIGH (ref 70–99)
Glucose-Capillary: 132 mg/dL — ABNORMAL HIGH (ref 70–99)
Glucose-Capillary: 136 mg/dL — ABNORMAL HIGH (ref 70–99)
Glucose-Capillary: 137 mg/dL — ABNORMAL HIGH (ref 70–99)
Glucose-Capillary: 142 mg/dL — ABNORMAL HIGH (ref 70–99)
Glucose-Capillary: 143 mg/dL — ABNORMAL HIGH (ref 70–99)

## 2020-11-07 LAB — BASIC METABOLIC PANEL
Anion gap: 8 (ref 5–15)
BUN: 31 mg/dL — ABNORMAL HIGH (ref 6–20)
CO2: 29 mmol/L (ref 22–32)
Calcium: 9.7 mg/dL (ref 8.9–10.3)
Chloride: 105 mmol/L (ref 98–111)
Creatinine, Ser: 0.75 mg/dL (ref 0.61–1.24)
GFR, Estimated: >60 mL/min (ref >60)
Glucose, Bld: 139 mg/dL — ABNORMAL HIGH (ref 70–99)
Potassium: 4.6 mmol/L (ref 3.5–5.1)
Sodium: 142 mmol/L (ref 135–145)

## 2020-11-07 LAB — CBC
HCT: 35.7 % — ABNORMAL LOW (ref 39.0–52.0)
Hemoglobin: 11.3 g/dL — ABNORMAL LOW (ref 13.0–17.0)
MCH: 27.6 pg (ref 26.0–34.0)
MCHC: 31.7 g/dL (ref 30.0–36.0)
MCV: 87.1 fL (ref 80.0–100.0)
Platelets: 682 10*3/uL — ABNORMAL HIGH (ref 150–400)
RBC: 4.1 MIL/uL — ABNORMAL LOW (ref 4.22–5.81)
RDW: 14 % (ref 11.5–15.5)
WBC: 14.5 10*3/uL — ABNORMAL HIGH (ref 4.0–10.5)
nRBC: 0 % (ref 0.0–0.2)
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
## 2020-11-08 LAB — BASIC METABOLIC PANEL
Anion gap: 8 (ref 5–15)
BUN: 37 mg/dL — ABNORMAL HIGH (ref 6–20)
CO2: 29 mmol/L (ref 22–32)
Calcium: 9.5 mg/dL (ref 8.9–10.3)
Chloride: 106 mmol/L (ref 98–111)
Creatinine, Ser: 0.88 mg/dL (ref 0.61–1.24)
GFR, Estimated: >60 mL/min (ref >60)
Glucose, Bld: 140 mg/dL — ABNORMAL HIGH (ref 70–99)
Potassium: 4.3 mmol/L (ref 3.5–5.1)
Sodium: 143 mmol/L (ref 135–145)

## 2020-11-08 LAB — CBC
HCT: 33.9 % — ABNORMAL LOW (ref 39.0–52.0)
Hemoglobin: 10.5 g/dL — ABNORMAL LOW (ref 13.0–17.0)
MCH: 27.3 pg (ref 26.0–34.0)
MCHC: 31 g/dL (ref 30.0–36.0)
MCV: 88.3 fL (ref 80.0–100.0)
Platelets: 697 10*3/uL — ABNORMAL HIGH (ref 150–400)
RBC: 3.84 MIL/uL — ABNORMAL LOW (ref 4.22–5.81)
RDW: 13.9 % (ref 11.5–15.5)
WBC: 11.4 10*3/uL — ABNORMAL HIGH (ref 4.0–10.5)
nRBC: 0 % (ref 0.0–0.2)

## 2020-11-08 LAB — GLUCOSE, CAPILLARY
Glucose-Capillary: 133 mg/dL — ABNORMAL HIGH (ref 70–99)
Glucose-Capillary: 131 mg/dL — ABNORMAL HIGH (ref 70–99)
Glucose-Capillary: 127 mg/dL — ABNORMAL HIGH (ref 70–99)
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
## 2020-11-09 LAB — CBC
HCT: 35.2 % — ABNORMAL LOW (ref 39.0–52.0)
Hemoglobin: 10.8 g/dL — ABNORMAL LOW (ref 13.0–17.0)
MCH: 27.4 pg (ref 26.0–34.0)
MCHC: 30.7 g/dL (ref 30.0–36.0)
MCV: 89.3 fL (ref 80.0–100.0)
Platelets: 649 10*3/uL — ABNORMAL HIGH (ref 150–400)
RBC: 3.94 MIL/uL — ABNORMAL LOW (ref 4.22–5.81)
RDW: 13.9 % (ref 11.5–15.5)
WBC: 11.2 10*3/uL — ABNORMAL HIGH (ref 4.0–10.5)
nRBC: 0 % (ref 0.0–0.2)

## 2020-11-09 LAB — BASIC METABOLIC PANEL
Anion gap: 9 (ref 5–15)
BUN: 39 mg/dL — ABNORMAL HIGH (ref 6–20)
CO2: 28 mmol/L (ref 22–32)
Calcium: 9.4 mg/dL (ref 8.9–10.3)
Chloride: 108 mmol/L (ref 98–111)
Creatinine, Ser: 0.82 mg/dL (ref 0.61–1.24)
GFR, Estimated: >60 mL/min (ref >60)
Glucose, Bld: 136 mg/dL — ABNORMAL HIGH (ref 70–99)
Potassium: 4.6 mmol/L (ref 3.5–5.1)
Sodium: 145 mmol/L (ref 135–145)

## 2020-11-09 LAB — GLUCOSE, CAPILLARY
Glucose-Capillary: 136 mg/dL — ABNORMAL HIGH (ref 70–99)
Glucose-Capillary: 132 mg/dL — ABNORMAL HIGH (ref 70–99)
Glucose-Capillary: 128 mg/dL — ABNORMAL HIGH (ref 70–99)
Glucose-Capillary: 128 mg/dL — ABNORMAL HIGH (ref 70–99)
Glucose-Capillary: 118 mg/dL — ABNORMAL HIGH (ref 70–99)
Glucose-Capillary: 114 mg/dL — ABNORMAL HIGH (ref 70–99)

## 2020-11-09 NOTE — Progress Notes (Signed)
Orthopedic Tech Progress Note Patient Details:  [name] 1970-10-05 [id number]  Ortho Devices Type of Ortho Device: Prafo boot/shoe Ortho Device/Splint Location: Bilateral Ortho Device/Splint Interventions: Ordered, Application   Post Interventions Patient Tolerated: Well  [employer] A Vidit Boissonneault 11/09/2020, 6:55 PM
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
## 2020-11-10 LAB — BASIC METABOLIC PANEL
Anion gap: 9 (ref 5–15)
BUN: 42 mg/dL — ABNORMAL HIGH (ref 6–20)
CO2: 28 mmol/L (ref 22–32)
Calcium: 9.3 mg/dL (ref 8.9–10.3)
Chloride: 109 mmol/L (ref 98–111)
Creatinine, Ser: 0.88 mg/dL (ref 0.61–1.24)
GFR, Estimated: >60 mL/min (ref >60)
Glucose, Bld: 121 mg/dL — ABNORMAL HIGH (ref 70–99)
Potassium: 4.3 mmol/L (ref 3.5–5.1)
Sodium: 146 mmol/L — ABNORMAL HIGH (ref 135–145)

## 2020-11-10 LAB — CBC
HCT: 32.8 % — ABNORMAL LOW (ref 39.0–52.0)
Hemoglobin: 10.3 g/dL — ABNORMAL LOW (ref 13.0–17.0)
MCH: 27.6 pg (ref 26.0–34.0)
MCHC: 31.4 g/dL (ref 30.0–36.0)
MCV: 87.9 fL (ref 80.0–100.0)
Platelets: 689 10*3/uL — ABNORMAL HIGH (ref 150–400)
RBC: 3.73 MIL/uL — ABNORMAL LOW (ref 4.22–5.81)
RDW: 13.7 % (ref 11.5–15.5)
WBC: 11.4 10*3/uL — ABNORMAL HIGH (ref 4.0–10.5)
nRBC: 0 % (ref 0.0–0.2)

## 2020-11-10 LAB — GLUCOSE, CAPILLARY
Glucose-Capillary: 113 mg/dL — ABNORMAL HIGH (ref 70–99)
Glucose-Capillary: 127 mg/dL — ABNORMAL HIGH (ref 70–99)
Glucose-Capillary: 131 mg/dL — ABNORMAL HIGH (ref 70–99)
Glucose-Capillary: 138 mg/dL — ABNORMAL HIGH (ref 70–99)
Glucose-Capillary: 162 mg/dL — ABNORMAL HIGH (ref 70–99)
Glucose-Capillary: 140 mg/dL — ABNORMAL HIGH (ref 70–99)

## 2020-11-10 NOTE — Progress Notes (Signed)
ETT holder changed. No complications noted. °

## 2020-11-10 NOTE — Progress Notes (Signed)
Pt placed back on full vent support due to RR in the high 30's. RN aware.

## 2020-11-10 NOTE — Progress Notes (Signed)
Pt placed on PSV 8/5 per wean protocol and pt is tolerating well at this time. RT to continue to monitor. 
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
## 2020-11-11 LAB — BASIC METABOLIC PANEL
Anion gap: 9 (ref 5–15)
BUN: 40 mg/dL — ABNORMAL HIGH (ref 6–20)
CO2: 27 mmol/L (ref 22–32)
Calcium: 9.4 mg/dL (ref 8.9–10.3)
Chloride: 110 mmol/L (ref 98–111)
Creatinine, Ser: 0.8 mg/dL (ref 0.61–1.24)
GFR, Estimated: >60 mL/min (ref >60)
Glucose, Bld: 153 mg/dL — ABNORMAL HIGH (ref 70–99)
Potassium: 4.2 mmol/L (ref 3.5–5.1)
Sodium: 146 mmol/L — ABNORMAL HIGH (ref 135–145)

## 2020-11-11 LAB — CULTURE, RESPIRATORY W GRAM STAIN

## 2020-11-11 LAB — GLUCOSE, CAPILLARY
Glucose-Capillary: 151 mg/dL — ABNORMAL HIGH (ref 70–99)
Glucose-Capillary: 125 mg/dL — ABNORMAL HIGH (ref 70–99)
Glucose-Capillary: 128 mg/dL — ABNORMAL HIGH (ref 70–99)
Glucose-Capillary: 128 mg/dL — ABNORMAL HIGH (ref 70–99)
Glucose-Capillary: 132 mg/dL — ABNORMAL HIGH (ref 70–99)

## 2020-11-11 LAB — CBC
HCT: 34.2 % — ABNORMAL LOW (ref 39.0–52.0)
Hemoglobin: 10.7 g/dL — ABNORMAL LOW (ref 13.0–17.0)
MCH: 27.6 pg (ref 26.0–34.0)
MCHC: 31.3 g/dL (ref 30.0–36.0)
MCV: 88.1 fL (ref 80.0–100.0)
Platelets: 644 10*3/uL — ABNORMAL HIGH (ref 150–400)
RBC: 3.88 MIL/uL — ABNORMAL LOW (ref 4.22–5.81)
RDW: 13.6 % (ref 11.5–15.5)
WBC: 11.9 10*3/uL — ABNORMAL HIGH (ref 4.0–10.5)
nRBC: 0 % (ref 0.0–0.2)
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
## 2020-11-11 NOTE — Progress Notes (Signed)
Pt placed on PSV 10/5 per wean protocol. Pt is tolerating well at this time. RT to continue to monitor. 

## 2020-11-11 NOTE — Progress Notes (Signed)
Pt placed back on full vent support due to increased WOB with RR in the 40's. RN at bedside.

## 2020-11-12 ENCOUNTER — Inpatient Hospital Stay (HOSPITAL_COMMUNITY): Payer: Managed Care, Other (non HMO) | Admitting: Registered Nurse

## 2020-11-12 ENCOUNTER — Inpatient Hospital Stay (HOSPITAL_COMMUNITY): Payer: Managed Care, Other (non HMO)

## 2020-11-12 LAB — GLUCOSE, CAPILLARY
Glucose-Capillary: 131 mg/dL — ABNORMAL HIGH (ref 70–99)
Glucose-Capillary: 132 mg/dL — ABNORMAL HIGH (ref 70–99)
Glucose-Capillary: 133 mg/dL — ABNORMAL HIGH (ref 70–99)
Glucose-Capillary: 136 mg/dL — ABNORMAL HIGH (ref 70–99)
Glucose-Capillary: 137 mg/dL — ABNORMAL HIGH (ref 70–99)
Glucose-Capillary: 149 mg/dL — ABNORMAL HIGH (ref 70–99)
Glucose-Capillary: 157 mg/dL — ABNORMAL HIGH (ref 70–99)

## 2020-11-12 MED ORDER — ROCURONIUM BROMIDE 100 MG/10ML IV SOLN
INTRAVENOUS | Status: DC | PRN
  Administered 2020-11-12: 15 mg via INTRAVENOUS

## 2020-11-12 MED ORDER — PROPOFOL 10 MG/ML IV BOLUS
INTRAVENOUS | Status: DC | PRN
  Administered 2020-11-12: 50 mg via INTRAVENOUS

## 2020-11-12 MED ORDER — SODIUM CHLORIDE 0.9 % IV SOLN: INTRAVENOUS | Status: DC | PRN

## 2020-11-12 MED ORDER — PROSOURCE TF PO LIQD
45.0000 mL | Freq: Three times a day (TID) | ORAL | Status: DC
  Administered 2020-11-12 – 2020-11-21 (×28): 45 mL
  Filled 2020-11-12 (×28): qty 45
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
## 2020-11-12 NOTE — Progress Notes (Signed)
Called patient's daughter this morning, no answer. Left VM. Daughter has not called back at this time. Patient's sister has called and stated to nursing staff that the patient's daughters are busy with their own children and that she is looking into how to obtain POA herself. I discussed with Palliative care team and they have also been having difficulty reaching the patient's daughters. Given difficulty in reaching legally designated decision-makers, other family being available and willing to make decisions, and interventions desired by primary point of contact being described as not in line with the patient's wishes/desires, I placed an Ethics consult to explore further to determine if shared decision-making remains a possibility.  Diamantina Monks, MD General and Trauma Surgery [facility] Surgery

## 2020-11-12 NOTE — Anesthesia Procedure Notes (Signed)
Procedure Name: Intubation Date/Time: 11/12/2020 6:50 PM Performed by: Zollie Scale, CRNA Pre-anesthesia Checklist: Patient identified, Emergency Drugs available, Suction available and Patient being monitored Patient Re-evaluated:Patient Re-evaluated prior to induction Oxygen Delivery Method: Circle System Utilized and Ambu bag Preoxygenation: Pre-oxygenation with 100% oxygen Induction Type: IV induction Laryngoscope Size: Glidescope and 4 Grade View: Grade I Tube type: Oral Tube size: 8.0 mm Number of attempts: 1 Airway Equipment and Method: Bougie stylet Placement Confirmation: ETT inserted through vocal cords under direct vision, positive ETCO2 and breath sounds checked- equal and bilateral Secured at: 24 cm Tube secured with: Commerical ICU device with bite block applied by RT. Dental Injury: Teeth and Oropharynx as per pre-operative assessment  Comments: This CRNA and Sampson Goon MD at bedside, noted prior ETT cuff leak, pre-oxygenation given via circle system/ventilator, vocal cords visualized via Glidescope, tube exchanger passed through ETT, visualized exchanger, ETT removed and new ETT inserted, secured at 24 at the teeth, + breath sounds bilaterally, secured via ICU device/bite block by RT, VSS.

## 2020-11-13 LAB — GLUCOSE, CAPILLARY
Glucose-Capillary: 159 mg/dL — ABNORMAL HIGH (ref 70–99)
Glucose-Capillary: 131 mg/dL — ABNORMAL HIGH (ref 70–99)
Glucose-Capillary: 152 mg/dL — ABNORMAL HIGH (ref 70–99)
Glucose-Capillary: 137 mg/dL — ABNORMAL HIGH (ref 70–99)
Glucose-Capillary: 142 mg/dL — ABNORMAL HIGH (ref 70–99)
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
## 2020-11-14 LAB — GLUCOSE, CAPILLARY
Glucose-Capillary: 120 mg/dL — ABNORMAL HIGH (ref 70–99)
Glucose-Capillary: 124 mg/dL — ABNORMAL HIGH (ref 70–99)
Glucose-Capillary: 135 mg/dL — ABNORMAL HIGH (ref 70–99)
Glucose-Capillary: 141 mg/dL — ABNORMAL HIGH (ref 70–99)
Glucose-Capillary: 151 mg/dL — ABNORMAL HIGH (ref 70–99)
Glucose-Capillary: 159 mg/dL — ABNORMAL HIGH (ref 70–99)
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
## 2020-11-14 NOTE — Progress Notes (Signed)
While bathing patient, condom catheter was removed and a stage 2 pressure injury was found. Condom Catheter removed and replaced with primofit. Will continue to monitor.
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
## 2020-11-15 ENCOUNTER — Encounter (HOSPITAL_COMMUNITY)
Admission: EM | Disposition: A | Discharge status: Nursing Home (Includes ICF) | Payer: Self-pay | Source: Home / Self Care

## 2020-11-15 ENCOUNTER — Inpatient Hospital Stay (HOSPITAL_COMMUNITY): Payer: Managed Care, Other (non HMO)

## 2020-11-15 ENCOUNTER — Inpatient Hospital Stay (HOSPITAL_COMMUNITY): Payer: Managed Care, Other (non HMO) | Admitting: Anesthesiology

## 2020-11-15 HISTORY — PX: TRACHEOSTOMY TUBE PLACEMENT: SHX814

## 2020-11-15 HISTORY — PX: PEG PLACEMENT: SHX5437

## 2020-11-15 LAB — GLUCOSE, CAPILLARY
Glucose-Capillary: 106 mg/dL — ABNORMAL HIGH (ref 70–99)
Glucose-Capillary: 109 mg/dL — ABNORMAL HIGH (ref 70–99)
Glucose-Capillary: 109 mg/dL — ABNORMAL HIGH (ref 70–99)
Glucose-Capillary: 111 mg/dL — ABNORMAL HIGH (ref 70–99)
Glucose-Capillary: 149 mg/dL — ABNORMAL HIGH (ref 70–99)
Glucose-Capillary: 171 mg/dL — ABNORMAL HIGH (ref 70–99)

## 2020-11-15 LAB — SURGICAL PCR SCREEN
MRSA, PCR: NEGATIVE
Staphylococcus aureus: POSITIVE — AB

## 2020-11-15 SURGERY — CREATION, TRACHEOSTOMY
Anesthesia: General | Site: Neck

## 2020-11-15 MED ORDER — ROCURONIUM BROMIDE 10 MG/ML (PF) SYRINGE
PREFILLED_SYRINGE | INTRAVENOUS | Status: DC | PRN
  Administered 2020-11-15: 50 mg via INTRAVENOUS
  Administered 2020-11-15: 20 mg via INTRAVENOUS

## 2020-11-15 MED ORDER — 0.9 % SODIUM CHLORIDE (POUR BTL) OPTIME
TOPICAL | Status: DC | PRN
  Administered 2020-11-15: 1000 mL

## 2020-11-15 MED ORDER — SUGAMMADEX SODIUM 200 MG/2ML IV SOLN
INTRAVENOUS | Status: DC | PRN
  Administered 2020-11-15: 200 mg via INTRAVENOUS

## 2020-11-15 MED ORDER — ROCURONIUM BROMIDE 10 MG/ML (PF) SYRINGE
PREFILLED_SYRINGE | INTRAVENOUS | Status: AC
  Filled 2020-11-15: qty 10

## 2020-11-15 MED ORDER — LACTATED RINGERS IV SOLN
INTRAVENOUS | Status: DC | PRN
Start: 2020-11-15 — End: 2020-11-15

## 2020-11-15 MED ORDER — SUCCINYLCHOLINE CHLORIDE 200 MG/10ML IV SOSY
PREFILLED_SYRINGE | INTRAVENOUS | Status: AC
  Filled 2020-11-15: qty 10

## 2020-11-15 MED ORDER — MUPIROCIN 2 % EX OINT
1.0000 "application " | TOPICAL_OINTMENT | Freq: Two times a day (BID) | CUTANEOUS | Status: AC
  Administered 2020-11-15 – 2020-11-19 (×10): 1 via NASAL
  Filled 2020-11-15 (×2): qty 22

## 2020-11-15 MED ORDER — ONDANSETRON HCL 4 MG/2ML IJ SOLN
INTRAMUSCULAR | Status: AC
  Filled 2020-11-15: qty 2

## 2020-11-15 MED ORDER — PHENYLEPHRINE HCL-NACL 20-0.9 MG/250ML-% IV SOLN
INTRAVENOUS | Status: DC | PRN
Start: 2020-11-15 — End: 2020-11-15
  Administered 2020-11-15: 25 ug/min via INTRAVENOUS

## 2020-11-15 MED ORDER — PHENYLEPHRINE 40 MCG/ML (10ML) SYRINGE FOR IV PUSH (FOR BLOOD PRESSURE SUPPORT)
PREFILLED_SYRINGE | INTRAVENOUS | Status: AC
  Filled 2020-11-15: qty 10

## 2020-11-15 MED ORDER — PROPOFOL 10 MG/ML IV BOLUS
INTRAVENOUS | Status: AC
  Filled 2020-11-15: qty 40

## 2020-11-15 MED ORDER — CEFAZOLIN SODIUM 1 G IJ SOLR
INTRAMUSCULAR | Status: AC
  Filled 2020-11-15: qty 20

## 2020-11-15 MED ORDER — MIDAZOLAM HCL 2 MG/2ML IJ SOLN
INTRAMUSCULAR | Status: AC
  Filled 2020-11-15: qty 2

## 2020-11-15 MED ORDER — CEFAZOLIN SODIUM-DEXTROSE 2-3 GM-%(50ML) IV SOLR
INTRAVENOUS | Status: DC | PRN
  Administered 2020-11-15: 2 g via INTRAVENOUS

## 2020-11-15 MED ORDER — FENTANYL CITRATE (PF) 250 MCG/5ML IJ SOLN
INTRAMUSCULAR | Status: AC
  Filled 2020-11-15: qty 5

## 2020-11-15 MED ORDER — LIDOCAINE 2% (20 MG/ML) 5 ML SYRINGE
INTRAMUSCULAR | Status: AC
  Filled 2020-11-15: qty 5

## 2020-11-15 MED ORDER — DEXAMETHASONE SODIUM PHOSPHATE 10 MG/ML IJ SOLN
INTRAMUSCULAR | Status: AC
  Filled 2020-11-15: qty 1

## 2020-11-15 SURGICAL SUPPLY — 45 items
BAG COUNTER SPONGE SURGICOUNT (BAG) ×3 IMPLANT
BLADE CLIPPER SURG (BLADE) ×3 IMPLANT
CANISTER SUCT 3000ML PPV (MISCELLANEOUS) ×3 IMPLANT
CATH GASTROSTOMY 20FR (CATHETERS) IMPLANT
CHLORAPREP W/TINT 26 (MISCELLANEOUS) ×3 IMPLANT
COVER SURGICAL LIGHT HANDLE (MISCELLANEOUS) ×3 IMPLANT
DRAPE LAPAROSCOPIC ABDOMINAL (DRAPES) ×3 IMPLANT
DRAPE UTILITY XL STRL (DRAPES) ×3 IMPLANT
ELECT CAUTERY BLADE 6.4 (BLADE) ×3 IMPLANT
ELECT REM PT RETURN 9FT ADLT (ELECTROSURGICAL) ×3
ELECTRODE REM PT RTRN 9FT ADLT (ELECTROSURGICAL) ×2 IMPLANT
GAUZE 4X4 16PLY ~~LOC~~+RFID DBL (SPONGE) ×3 IMPLANT
GAUZE SPONGE 4X4 12PLY STRL (GAUZE/BANDAGES/DRESSINGS) ×6 IMPLANT
GLOVE SRG 8 PF TXTR STRL LF DI (GLOVE) ×2 IMPLANT
GLOVE SURG ENC MOIS LTX SZ8 (GLOVE) ×6 IMPLANT
GLOVE SURG UNDER POLY LF SZ8 (GLOVE) ×1
GOWN STRL REUS W/ TWL LRG LVL3 (GOWN DISPOSABLE) ×2 IMPLANT
GOWN STRL REUS W/ TWL XL LVL3 (GOWN DISPOSABLE) ×2 IMPLANT
GOWN STRL REUS W/TWL LRG LVL3 (GOWN DISPOSABLE) ×1
GOWN STRL REUS W/TWL XL LVL3 (GOWN DISPOSABLE) ×1
HANDLE SUCTION POOLE (INSTRUMENTS) ×2 IMPLANT
INTRODUCER TRACH BLUE RHINO 6F (TUBING) ×3 IMPLANT
INTRODUCER TRACH BLUE RHINO 8F (TUBING) IMPLANT
KIT BASIN OR (CUSTOM PROCEDURE TRAY) ×3 IMPLANT
KIT TURNOVER KIT B (KITS) ×3 IMPLANT
NS IRRIG 1000ML POUR BTL (IV SOLUTION) ×3 IMPLANT
PACK EENT II TURBAN DRAPE (CUSTOM PROCEDURE TRAY) ×3 IMPLANT
PACK GENERAL/GYN (CUSTOM PROCEDURE TRAY) ×3 IMPLANT
PAD ARMBOARD 7.5X6 YLW CONV (MISCELLANEOUS) ×6 IMPLANT
PENCIL BUTTON HOLSTER BLD 10FT (ELECTRODE) ×3 IMPLANT
PENCIL SMOKE EVACUATOR (MISCELLANEOUS) ×3 IMPLANT
PLUG CATH AND CAP STER (CATHETERS) ×3 IMPLANT
SPONGE INTESTINAL PEANUT (DISPOSABLE) ×3 IMPLANT
SUCTION POOLE HANDLE (INSTRUMENTS) ×3
SUT PDS AB 1 CT  36 (SUTURE) ×1
SUT PDS AB 1 CT 36 (SUTURE) ×2 IMPLANT
SUT SILK 2 0 SH (SUTURE) ×6 IMPLANT
SUT SILK 2 0 SH CR/8 (SUTURE) ×3 IMPLANT
SUT VICRYL AB 3 0 TIES (SUTURE) ×3 IMPLANT
SYR 20ML LL LF (SYRINGE) ×3 IMPLANT
SYRINGE TOOMEY DISP (SYRINGE) ×3 IMPLANT
TOWEL GREEN STERILE (TOWEL DISPOSABLE) ×3 IMPLANT
TOWEL GREEN STERILE FF (TOWEL DISPOSABLE) ×3 IMPLANT
TUBE CONNECTING 12X1/4 (SUCTIONS) ×3 IMPLANT
TUBE ENDOVIVE SAFETY PEG 24 (TUBING) ×3 IMPLANT

## 2020-11-15 NOTE — Anesthesia Procedure Notes (Signed)
Date/Time: 11/15/2020 7:55 AM Performed by: Demetrio Lapping, CRNA Pre-anesthesia Checklist: Emergency Drugs available, Suction available, Patient being monitored and Timeout performed Patient Re-evaluated:Patient Re-evaluated prior to induction Oxygen Delivery Method: Circle system utilized Preoxygenation: Pre-oxygenation with 100% oxygen Induction Type: Inhalational induction with existing ETT Placement Confirmation: positive ETCO2 Dental Injury: Teeth and Oropharynx as per pre-operative assessment

## 2020-11-15 NOTE — Op Note (Signed)
  11/15/2020  8:48 AM  PATIENT:  Isaiah Solis  50 y.o. male  PRE-OPERATIVE DIAGNOSIS:  respiratory failure  POST-OPERATIVE DIAGNOSIS:  respiratory failure  PROCEDURE:  Procedure(s): TRACHEOSTOMY PERCUTANEOUS ENDOSCOPIC GASTROSTOMY (PEG) PLACEMENT  SURGEON:  Violeta Gelinas, MD  ASSISTANTS: Kris Mouton, MD   ANESTHESIA:   general  EBL:  No intake/output data recorded.  BLOOD ADMINISTERED:none  DRAINS: Gastrostomy Tube   SPECIMEN:  No Specimen  DISPOSITION OF SPECIMEN:  N/A  COUNTS:  YES  DICTATION: .Dragon Dictation Patient was brought directly from the intensive care unit to the operating room.  Informed consent was obtained.  General anesthesia was administered by the anesthesia staff.  The anterior cervical collar was removed and his head was maintained in neutral position with appropriate padding and tape.  His anterior neck was prepped and draped in a sterile fashion.  We did a timeout procedure.  Local was injected over the trachea.  A vertical incision was made.  Subcutaneous tissues were dissected down through the platysma.  The strap muscles were split along the midline.  The thyroid isthmus was divided with cautery carefully getting good hemostasis.  The anterior trachea was exposed.  The endotracheal tube was withdrawn several centimeters.  Angiocath was inserted between the second and third tracheal ring followed by guidewire.  The small blue dilator was placed followed by the large Blue Rhino dilator.  Next a #6 tracheostomy was placed over a 24 French balloon dilator.  It went into the trachea easily.  The cuff was inflated and it was hooked up to the ventilator circuit.  There was good volume returns and sats remained 100%.  The trach was secured with 2-0 silk and a Velcro trach tie was placed.  There was excellent hemostasis.  He tolerated this portion of the procedure well and all counts were correct.  Next we proceeded with PEG tube placement.  Please refer to  Provation for the operative note.  Patient tolerated both procedures well and was taken directly back to the intensive care unit on the ventilator. PATIENT DISPOSITION:  ICU - intubated and hemodynamically stable.   Delay start of Pharmacological VTE agent (>24hrs) due to surgical blood loss or risk of bleeding:  no  Violeta Gelinas, MD, MPH, FACS Pager: [phone]  8/25/20228:48 AM
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
## 2020-11-15 NOTE — Anesthesia Postprocedure Evaluation (Signed)
Anesthesia Post Note  Patient: Isaiah Solis  Procedure(s) Performed: TRACHEOSTOMY (Neck) PERCUTANEOUS ENDOSCOPIC GASTROSTOMY (PEG) PLACEMENT (Abdomen)     Patient location during evaluation: SICU Anesthesia Type: General Level of consciousness: sedated Pain management: pain level controlled Vital Signs Assessment: post-procedure vital signs reviewed and stable Respiratory status: patient remains intubated per anesthesia plan Cardiovascular status: stable Postop Assessment: no apparent nausea or vomiting Anesthetic complications: no   No notable events documented.  Last Vitals:  Vitals:   11/15/20 1600 11/15/20 1700  BP: (!) 117/91 (!) 112/97  Pulse: (!) 101 (!) 103  Resp: 16 14  Temp: 37.4 C   SpO2: 98% 99%    Last Pain:  Vitals:   11/15/20 1600  TempSrc: Axillary                 Kennieth Rad

## 2020-11-15 NOTE — Transfer of Care (Signed)
Immediate Anesthesia Transfer of Care Note  Patient: Isaiah Solis  Procedure(s) Performed: TRACHEOSTOMY (Neck) PERCUTANEOUS ENDOSCOPIC GASTROSTOMY (PEG) PLACEMENT (Abdomen)  Patient Location: ICU  Anesthesia Type:General  Level of Consciousness: obtunded and Patient remains intubated per anesthesia plan  (per trach) Airway & Oxygen Therapy: Patient Spontanous Breathing, Patient remains intubated per anesthesia plan and Patient placed on Ventilator (see vital sign flow sheet for setting)  Post-op Assessment: Report given to RN and Post -op Vital signs reviewed and stable  Post vital signs: Reviewed and stable  Last Vitals:  Vitals Value Taken Time  BP 98/85 (91)   Temp    Pulse 90   Resp 16   SpO2 100% on 30% FiO2     Last Pain:  Vitals:   11/15/20 0741  TempSrc: Axillary         Complications: No notable events documented.

## 2020-11-16 ENCOUNTER — Encounter (HOSPITAL_COMMUNITY): Payer: Self-pay | Admitting: General Surgery

## 2020-11-16 LAB — BASIC METABOLIC PANEL
Anion gap: 11 (ref 5–15)
BUN: 37 mg/dL — ABNORMAL HIGH (ref 6–20)
CO2: 29 mmol/L (ref 22–32)
Calcium: 9.6 mg/dL (ref 8.9–10.3)
Chloride: 110 mmol/L (ref 98–111)
Creatinine, Ser: 0.77 mg/dL (ref 0.61–1.24)
GFR, Estimated: >60 mL/min (ref >60)
Glucose, Bld: 138 mg/dL — ABNORMAL HIGH (ref 70–99)
Potassium: 4.1 mmol/L (ref 3.5–5.1)
Sodium: 150 mmol/L — ABNORMAL HIGH (ref 135–145)

## 2020-11-16 LAB — CBC
HCT: 31.6 % — ABNORMAL LOW (ref 39.0–52.0)
Hemoglobin: 9.8 g/dL — ABNORMAL LOW (ref 13.0–17.0)
MCH: 27.8 pg (ref 26.0–34.0)
MCHC: 31 g/dL (ref 30.0–36.0)
MCV: 89.5 fL (ref 80.0–100.0)
Platelets: 463 10*3/uL — ABNORMAL HIGH (ref 150–400)
RBC: 3.53 MIL/uL — ABNORMAL LOW (ref 4.22–5.81)
RDW: 13.4 % (ref 11.5–15.5)
WBC: 13.9 10*3/uL — ABNORMAL HIGH (ref 4.0–10.5)
nRBC: 0 % (ref 0.0–0.2)

## 2020-11-16 LAB — GLUCOSE, CAPILLARY
Glucose-Capillary: 150 mg/dL — ABNORMAL HIGH (ref 70–99)
Glucose-Capillary: 134 mg/dL — ABNORMAL HIGH (ref 70–99)
Glucose-Capillary: 133 mg/dL — ABNORMAL HIGH (ref 70–99)
Glucose-Capillary: 140 mg/dL — ABNORMAL HIGH (ref 70–99)
Glucose-Capillary: 129 mg/dL — ABNORMAL HIGH (ref 70–99)
Glucose-Capillary: 148 mg/dL — ABNORMAL HIGH (ref 70–99)

## 2020-11-16 MED ORDER — JEVITY 1.5 CAL/FIBER PO LIQD
1000.0000 mL | ORAL | Status: DC
  Administered 2020-11-16 – 2020-11-21 (×8): 1000 mL
  Filled 2020-11-16 (×9): qty 1000

## 2020-11-16 MED ORDER — FREE WATER
200.0000 mL | Freq: Four times a day (QID) | Status: DC
  Administered 2020-11-16 – 2020-11-21 (×21): 200 mL
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
## 2020-11-17 LAB — BASIC METABOLIC PANEL
Anion gap: 7 (ref 5–15)
BUN: 30 mg/dL — ABNORMAL HIGH (ref 6–20)
CO2: 28 mmol/L (ref 22–32)
Calcium: 9.5 mg/dL (ref 8.9–10.3)
Chloride: 111 mmol/L (ref 98–111)
Creatinine, Ser: 0.81 mg/dL (ref 0.61–1.24)
GFR, Estimated: >60 mL/min (ref >60)
Glucose, Bld: 153 mg/dL — ABNORMAL HIGH (ref 70–99)
Potassium: 3.7 mmol/L (ref 3.5–5.1)
Sodium: 146 mmol/L — ABNORMAL HIGH (ref 135–145)

## 2020-11-17 LAB — GLUCOSE, CAPILLARY
Glucose-Capillary: 122 mg/dL — ABNORMAL HIGH (ref 70–99)
Glucose-Capillary: 128 mg/dL — ABNORMAL HIGH (ref 70–99)
Glucose-Capillary: 133 mg/dL — ABNORMAL HIGH (ref 70–99)
Glucose-Capillary: 136 mg/dL — ABNORMAL HIGH (ref 70–99)
Glucose-Capillary: 140 mg/dL — ABNORMAL HIGH (ref 70–99)
Glucose-Capillary: 154 mg/dL — ABNORMAL HIGH (ref 70–99)
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
## 2020-11-18 ENCOUNTER — Encounter (HOSPITAL_COMMUNITY): Payer: Self-pay | Admitting: Anesthesiology

## 2020-11-18 LAB — GLUCOSE, CAPILLARY
Glucose-Capillary: 129 mg/dL — ABNORMAL HIGH (ref 70–99)
Glucose-Capillary: 133 mg/dL — ABNORMAL HIGH (ref 70–99)
Glucose-Capillary: 136 mg/dL — ABNORMAL HIGH (ref 70–99)
Glucose-Capillary: 119 mg/dL — ABNORMAL HIGH (ref 70–99)
Glucose-Capillary: 138 mg/dL — ABNORMAL HIGH (ref 70–99)
Glucose-Capillary: 149 mg/dL — ABNORMAL HIGH (ref 70–99)
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
## 2020-11-19 ENCOUNTER — Encounter (HOSPITAL_COMMUNITY)
Admission: EM | Disposition: A | Discharge status: Nursing Home (Includes ICF) | Payer: Self-pay | Source: Home / Self Care

## 2020-11-19 LAB — GLUCOSE, CAPILLARY
Glucose-Capillary: 134 mg/dL — ABNORMAL HIGH (ref 70–99)
Glucose-Capillary: 136 mg/dL — ABNORMAL HIGH (ref 70–99)
Glucose-Capillary: 138 mg/dL — ABNORMAL HIGH (ref 70–99)
Glucose-Capillary: 139 mg/dL — ABNORMAL HIGH (ref 70–99)
Glucose-Capillary: 140 mg/dL — ABNORMAL HIGH (ref 70–99)
Glucose-Capillary: 150 mg/dL — ABNORMAL HIGH (ref 70–99)

## 2020-11-19 SURGERY — CREATION, TRACHEOSTOMY
Anesthesia: General
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
## 2020-11-20 LAB — GLUCOSE, CAPILLARY
Glucose-Capillary: 136 mg/dL — ABNORMAL HIGH (ref 70–99)
Glucose-Capillary: 122 mg/dL — ABNORMAL HIGH (ref 70–99)
Glucose-Capillary: 142 mg/dL — ABNORMAL HIGH (ref 70–99)
Glucose-Capillary: 133 mg/dL — ABNORMAL HIGH (ref 70–99)
Glucose-Capillary: 125 mg/dL — ABNORMAL HIGH (ref 70–99)
Glucose-Capillary: 127 mg/dL — ABNORMAL HIGH (ref 70–99)
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
## 2020-11-20 NOTE — TOC Progression Note (Addendum)
Transition of Care [facility]) - Progression Note    Patient Details  Name: Isaiah Solis MRN: [id number] Date of Birth: 1970-09-09  Transition of [facility]) CM/SW Contact  Lawerance Sabal, RN Phone Number: 11/20/2020, 12:03 PM  Clinical Narrative:    12:00 Requested by Trauma MD to speak w family re LTACHs. Patient does not have HCPOA.  LVM with daughters Arlee Muslim and Cristal Ford requesting callback.  Spoke w daughter Willaim Bane who was agreeable to have [facility] and [facility] assess for candidacy.  [facility] declined at this time due to poor prognosis. [facility] stated they could consider patient and are awaiting callback from CM for them to start insurance auth. Informed [facility] of [facility] and [facility]'s assessments. Dimia included [name] on the call and they would like to speak with patient's sister Helmut Muster before giving permission for [facility] to start auth.  They called back and granted permission for [facility] to start auth.  Dorna Leitz notified.  14:00 Received call back from Nalu Troublefield who is agreeable to have [facility] start auth as well.  Will need 3 out of 4 of the daughters to agree to discharge to facility.  CM will continue to follow.    Arlee Muslim Daughter   [phone]  Mierzwa,Shay Daughter   [phone]  Dillard,Dimia Daughter   [phone]  Josecarlos, Harriott Daughter   [phone]  Colon,Alicia Sister   [phone]  Hulet,Kim Sister   [phone]  Charlie Pitter Significant other   [phone]   Birth order of children is Denver Faster, Alvino Chapel, Dimia, then Victor.   Expected Discharge Plan: Long Term Acute Care (LTAC) Barriers to Discharge: Continued Medical Work up  Expected Discharge Plan and Services Expected Discharge Plan: Long Term Acute Care (LTAC)   Discharge Planning Services: CM Consult   Living arrangements for the past 2 months: Single Family Home                                       Social Determinants of Health (SDOH) Interventions    Readmission Risk  Interventions No flowsheet data found.

## 2020-11-21 LAB — CBC
HCT: 30.1 % — ABNORMAL LOW (ref 39.0–52.0)
Hemoglobin: 9.3 g/dL — ABNORMAL LOW (ref 13.0–17.0)
MCH: 27.6 pg (ref 26.0–34.0)
MCHC: 30.9 g/dL (ref 30.0–36.0)
MCV: 89.3 fL (ref 80.0–100.0)
Platelets: 384 10*3/uL (ref 150–400)
RBC: 3.37 MIL/uL — ABNORMAL LOW (ref 4.22–5.81)
RDW: 13.5 % (ref 11.5–15.5)
WBC: 8.3 10*3/uL (ref 4.0–10.5)
nRBC: 0 % (ref 0.0–0.2)

## 2020-11-21 LAB — GLUCOSE, CAPILLARY
Glucose-Capillary: 143 mg/dL — ABNORMAL HIGH (ref 70–99)
Glucose-Capillary: 108 mg/dL — ABNORMAL HIGH (ref 70–99)
Glucose-Capillary: 134 mg/dL — ABNORMAL HIGH (ref 70–99)
Glucose-Capillary: 153 mg/dL — ABNORMAL HIGH (ref 70–99)

## 2020-11-21 LAB — BASIC METABOLIC PANEL
Anion gap: 8 (ref 5–15)
BUN: 33 mg/dL — ABNORMAL HIGH (ref 6–20)
CO2: 29 mmol/L (ref 22–32)
Calcium: 9.8 mg/dL (ref 8.9–10.3)
Chloride: 111 mmol/L (ref 98–111)
Creatinine, Ser: 0.82 mg/dL (ref 0.61–1.24)
GFR, Estimated: >60 mL/min (ref >60)
Glucose, Bld: 122 mg/dL — ABNORMAL HIGH (ref 70–99)
Potassium: 4.1 mmol/L (ref 3.5–5.1)
Sodium: 148 mmol/L — ABNORMAL HIGH (ref 135–145)

## 2020-11-21 MED ORDER — PROSOURCE TF PO LIQD: 45.0000 mL | Freq: Three times a day (TID) | ORAL | 12 refills | Status: AC

## 2020-11-21 MED ORDER — ACETAMINOPHEN 500 MG PO TABS: 1000.0000 mg | ORAL_TABLET | Freq: Four times a day (QID) | ORAL | 1 refills | Status: AC

## 2020-11-21 MED ORDER — ENOXAPARIN SODIUM 30 MG/0.3ML IJ SOSY: 30.0000 mg | PREFILLED_SYRINGE | Freq: Two times a day (BID) | INTRAMUSCULAR | 2 refills | Status: AC

## 2020-11-21 MED ORDER — PROPRANOLOL HCL 20 MG PO TABS: 20.0000 mg | ORAL_TABLET | Freq: Three times a day (TID) | ORAL | 2 refills | Status: AC

## 2020-11-21 MED ORDER — JEVITY 1.5 CAL/FIBER PO LIQD: 1000.0000 mL | ORAL | 12 refills | Status: AC

## 2020-11-21 MED ORDER — PANTOPRAZOLE SODIUM 40 MG PO PACK: 40.0000 mg | PACK | Freq: Every day | ORAL | 2 refills | Status: AC

## 2020-11-21 NOTE — Progress Notes (Signed)
Patient being picked up by [facility] at this time.
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
## 2020-11-21 NOTE — TOC Transition Note (Addendum)
Transition of Care [facility]) - CM/SW Discharge Note   Patient Details  Name: Isaiah Solis MRN: [id number] Date of Birth: 10/22/1970  [facility]) CM/SW Contact:  Lawerance Sabal, RN Phone Number: 11/21/2020, 3:35 PM   Clinical Narrative:    [facility]. Verbal confirmation for consent to DC to [facility] obtained from 3 of 4 daughters. Isaiah Solis, and Isaiah Solis.  Patient will go to room 314 and accepting MD is Dr Petra Kuba.  Provided MD and nurse with phone number for report [phone] or [phone].  [facility] arranged for 6pm pick up. Nurse instructed to call [facility] for report at [phone]        Isaiah Solis Daughter     [phone]  Isaiah Solis,Isaiah Solis Daughter     [phone]  Isaiah Solis,Isaiah Daughter     [phone]  Isaiah Solis, Isaiah Solis Daughter     [phone]   Spoke w patient's sister Isaiah Solis who may need short term disability forms filled out for [employer]. Provided her with CM office number. Notified CMAs and CM covering 4N tomorrow, Judeth Cornfield.    Final next level of care: Long Term Acute Care (LTAC) Barriers to Discharge: Continued Medical Work up   Patient Goals and CMS Choice Patient states their goals for this hospitalization and ongoing recovery are:: possibly LTACH Per Isaiah and [name]      Discharge Placement                       Discharge Plan and Services   Discharge Planning Services: CM Consult                                 Social Determinants of Health (SDOH) Interventions     Readmission Risk Interventions No flowsheet data found.

## 2020-11-21 NOTE — TOC Progression Note (Signed)
Transition of Care [facility]) - Progression Note    Patient Details  Name: Isaiah Solis MRN: [id number] Date of Birth: 02-20-1971  [facility] CM/SW Contact  Lawerance Sabal, RN Phone Number: 11/21/2020, 7:47 AM  Clinical Narrative:    Notified by Netta Cedars that patient would need to have a Medicaid application started before continuing with admission process.  Email sent to financial counselor to assess if one has been started, and to start one if it has not.     Expected Discharge Plan: Long Term Acute Care (LTAC) Barriers to Discharge: Continued Medical Work up  Expected Discharge Plan and Services Expected Discharge Plan: Long Term Acute Care (LTAC)   Discharge Planning Services: CM Consult   Living arrangements for the past 2 months: Single Family Home                                       Social Determinants of Health (SDOH) Interventions    Readmission Risk Interventions No flowsheet data found.

## 2020-11-21 NOTE — Progress Notes (Signed)
Patient ID: Isaiah Solis, male   DOB: [birth year], 50 y.o.   MRN: [id number] BP (!) 117/92 (BP Location: Left Wrist)   Pulse (!) 107   Temp 100 F (37.8 C) (Axillary)   Resp 16   Ht 6' (1.829 m)   Wt 61.8 kg   SpO2 99%   BMI 18.48 kg/m  I do not believe that CPR is a meaningful treatment for this gentleman. It is a futile intervention.

## 2020-11-21 NOTE — Progress Notes (Addendum)
I just spoke to Autumn, Charity fundraiser at [facility] and gave report on patient. All questions answered. I gave her my call back number in case there are any further questions.   I also updated [facility] on patient's transport. They will be here around 1800.   Signed and filled out gold DNR form and the rest of patient's paper chart at bedside.  Sherral Hammers RN

## 2020-11-22 ENCOUNTER — Encounter (HOSPITAL_COMMUNITY): Payer: Self-pay | Admitting: *Deleted

## 2020-11-22 DIAGNOSIS — J189 Pneumonia, unspecified organism: Secondary | ICD-10-CM

## 2020-11-22 DIAGNOSIS — J9811 Atelectasis: Secondary | ICD-10-CM

## 2020-11-22 DIAGNOSIS — S069X1A Unspecified intracranial injury with loss of consciousness of 30 minutes or less, initial encounter: Secondary | ICD-10-CM

## 2020-11-22 DIAGNOSIS — R6521 Severe sepsis with septic shock: Secondary | ICD-10-CM

## 2020-11-22 DIAGNOSIS — J9621 Acute and chronic respiratory failure with hypoxia: Secondary | ICD-10-CM

## 2020-12-03 DIAGNOSIS — J9621 Acute and chronic respiratory failure with hypoxia: Secondary | ICD-10-CM

## 2020-12-03 DIAGNOSIS — S069X1A Unspecified intracranial injury with loss of consciousness of 30 minutes or less, initial encounter: Secondary | ICD-10-CM

## 2020-12-03 DIAGNOSIS — J9811 Atelectasis: Secondary | ICD-10-CM

## 2020-12-03 DIAGNOSIS — J189 Pneumonia, unspecified organism: Secondary | ICD-10-CM

## 2020-12-03 DIAGNOSIS — R6521 Severe sepsis with septic shock: Secondary | ICD-10-CM

## 2020-12-04 DIAGNOSIS — R6521 Severe sepsis with septic shock: Secondary | ICD-10-CM

## 2020-12-04 DIAGNOSIS — S069X1A Unspecified intracranial injury with loss of consciousness of 30 minutes or less, initial encounter: Secondary | ICD-10-CM

## 2020-12-04 DIAGNOSIS — J9621 Acute and chronic respiratory failure with hypoxia: Secondary | ICD-10-CM

## 2020-12-04 DIAGNOSIS — J189 Pneumonia, unspecified organism: Secondary | ICD-10-CM

## 2020-12-04 DIAGNOSIS — J9811 Atelectasis: Secondary | ICD-10-CM

## 2020-12-05 DIAGNOSIS — R6521 Severe sepsis with septic shock: Secondary | ICD-10-CM

## 2020-12-05 DIAGNOSIS — J9621 Acute and chronic respiratory failure with hypoxia: Secondary | ICD-10-CM

## 2020-12-05 DIAGNOSIS — S069X1A Unspecified intracranial injury with loss of consciousness of 30 minutes or less, initial encounter: Secondary | ICD-10-CM

## 2020-12-05 DIAGNOSIS — J9811 Atelectasis: Secondary | ICD-10-CM

## 2020-12-05 DIAGNOSIS — J189 Pneumonia, unspecified organism: Secondary | ICD-10-CM

## 2020-12-06 DIAGNOSIS — J189 Pneumonia, unspecified organism: Secondary | ICD-10-CM

## 2020-12-06 DIAGNOSIS — J9811 Atelectasis: Secondary | ICD-10-CM

## 2020-12-06 DIAGNOSIS — S069X1A Unspecified intracranial injury with loss of consciousness of 30 minutes or less, initial encounter: Secondary | ICD-10-CM

## 2020-12-06 DIAGNOSIS — J9621 Acute and chronic respiratory failure with hypoxia: Secondary | ICD-10-CM

## 2020-12-06 DIAGNOSIS — R6521 Severe sepsis with septic shock: Secondary | ICD-10-CM

## 2020-12-07 DIAGNOSIS — J9811 Atelectasis: Secondary | ICD-10-CM

## 2020-12-07 DIAGNOSIS — S069X1A Unspecified intracranial injury with loss of consciousness of 30 minutes or less, initial encounter: Secondary | ICD-10-CM

## 2020-12-07 DIAGNOSIS — J189 Pneumonia, unspecified organism: Secondary | ICD-10-CM

## 2020-12-07 DIAGNOSIS — R6521 Severe sepsis with septic shock: Secondary | ICD-10-CM

## 2020-12-07 DIAGNOSIS — J9621 Acute and chronic respiratory failure with hypoxia: Secondary | ICD-10-CM

## 2020-12-08 DIAGNOSIS — J9621 Acute and chronic respiratory failure with hypoxia: Secondary | ICD-10-CM

## 2020-12-08 DIAGNOSIS — J9811 Atelectasis: Secondary | ICD-10-CM

## 2020-12-08 DIAGNOSIS — S069X1A Unspecified intracranial injury with loss of consciousness of 30 minutes or less, initial encounter: Secondary | ICD-10-CM

## 2020-12-08 DIAGNOSIS — J189 Pneumonia, unspecified organism: Secondary | ICD-10-CM

## 2020-12-08 DIAGNOSIS — R6521 Severe sepsis with septic shock: Secondary | ICD-10-CM

## 2020-12-09 DIAGNOSIS — J9621 Acute and chronic respiratory failure with hypoxia: Secondary | ICD-10-CM

## 2020-12-09 DIAGNOSIS — R6521 Severe sepsis with septic shock: Secondary | ICD-10-CM

## 2020-12-09 DIAGNOSIS — J189 Pneumonia, unspecified organism: Secondary | ICD-10-CM

## 2020-12-09 DIAGNOSIS — S069X1A Unspecified intracranial injury with loss of consciousness of 30 minutes or less, initial encounter: Secondary | ICD-10-CM

## 2020-12-09 DIAGNOSIS — J9811 Atelectasis: Secondary | ICD-10-CM

## 2020-12-17 DIAGNOSIS — R6521 Severe sepsis with septic shock: Secondary | ICD-10-CM

## 2020-12-17 DIAGNOSIS — J189 Pneumonia, unspecified organism: Secondary | ICD-10-CM

## 2020-12-17 DIAGNOSIS — J9621 Acute and chronic respiratory failure with hypoxia: Secondary | ICD-10-CM

## 2020-12-17 DIAGNOSIS — J9811 Atelectasis: Secondary | ICD-10-CM

## 2020-12-17 DIAGNOSIS — S069X1A Unspecified intracranial injury with loss of consciousness of 30 minutes or less, initial encounter: Secondary | ICD-10-CM

## 2020-12-18 DIAGNOSIS — J9621 Acute and chronic respiratory failure with hypoxia: Secondary | ICD-10-CM

## 2020-12-18 DIAGNOSIS — R6521 Severe sepsis with septic shock: Secondary | ICD-10-CM

## 2020-12-18 DIAGNOSIS — J9811 Atelectasis: Secondary | ICD-10-CM

## 2020-12-18 DIAGNOSIS — J189 Pneumonia, unspecified organism: Secondary | ICD-10-CM

## 2020-12-18 DIAGNOSIS — S069X1A Unspecified intracranial injury with loss of consciousness of 30 minutes or less, initial encounter: Secondary | ICD-10-CM

## 2020-12-19 DIAGNOSIS — S069X1A Unspecified intracranial injury with loss of consciousness of 30 minutes or less, initial encounter: Secondary | ICD-10-CM

## 2020-12-19 DIAGNOSIS — R6521 Severe sepsis with septic shock: Secondary | ICD-10-CM

## 2020-12-19 DIAGNOSIS — J9621 Acute and chronic respiratory failure with hypoxia: Secondary | ICD-10-CM

## 2020-12-19 DIAGNOSIS — J9811 Atelectasis: Secondary | ICD-10-CM

## 2020-12-19 DIAGNOSIS — J189 Pneumonia, unspecified organism: Secondary | ICD-10-CM

## 2022-02-16 IMAGING — DX DG CHEST 1V PORT
1 series · 1 of 1 positions shown · non-contrast
Comparison: 10/26/2020

CLINICAL DATA: Respiratory failure chest tube

EXAM:
PORTABLE CHEST 1 VIEW

[chest ap]
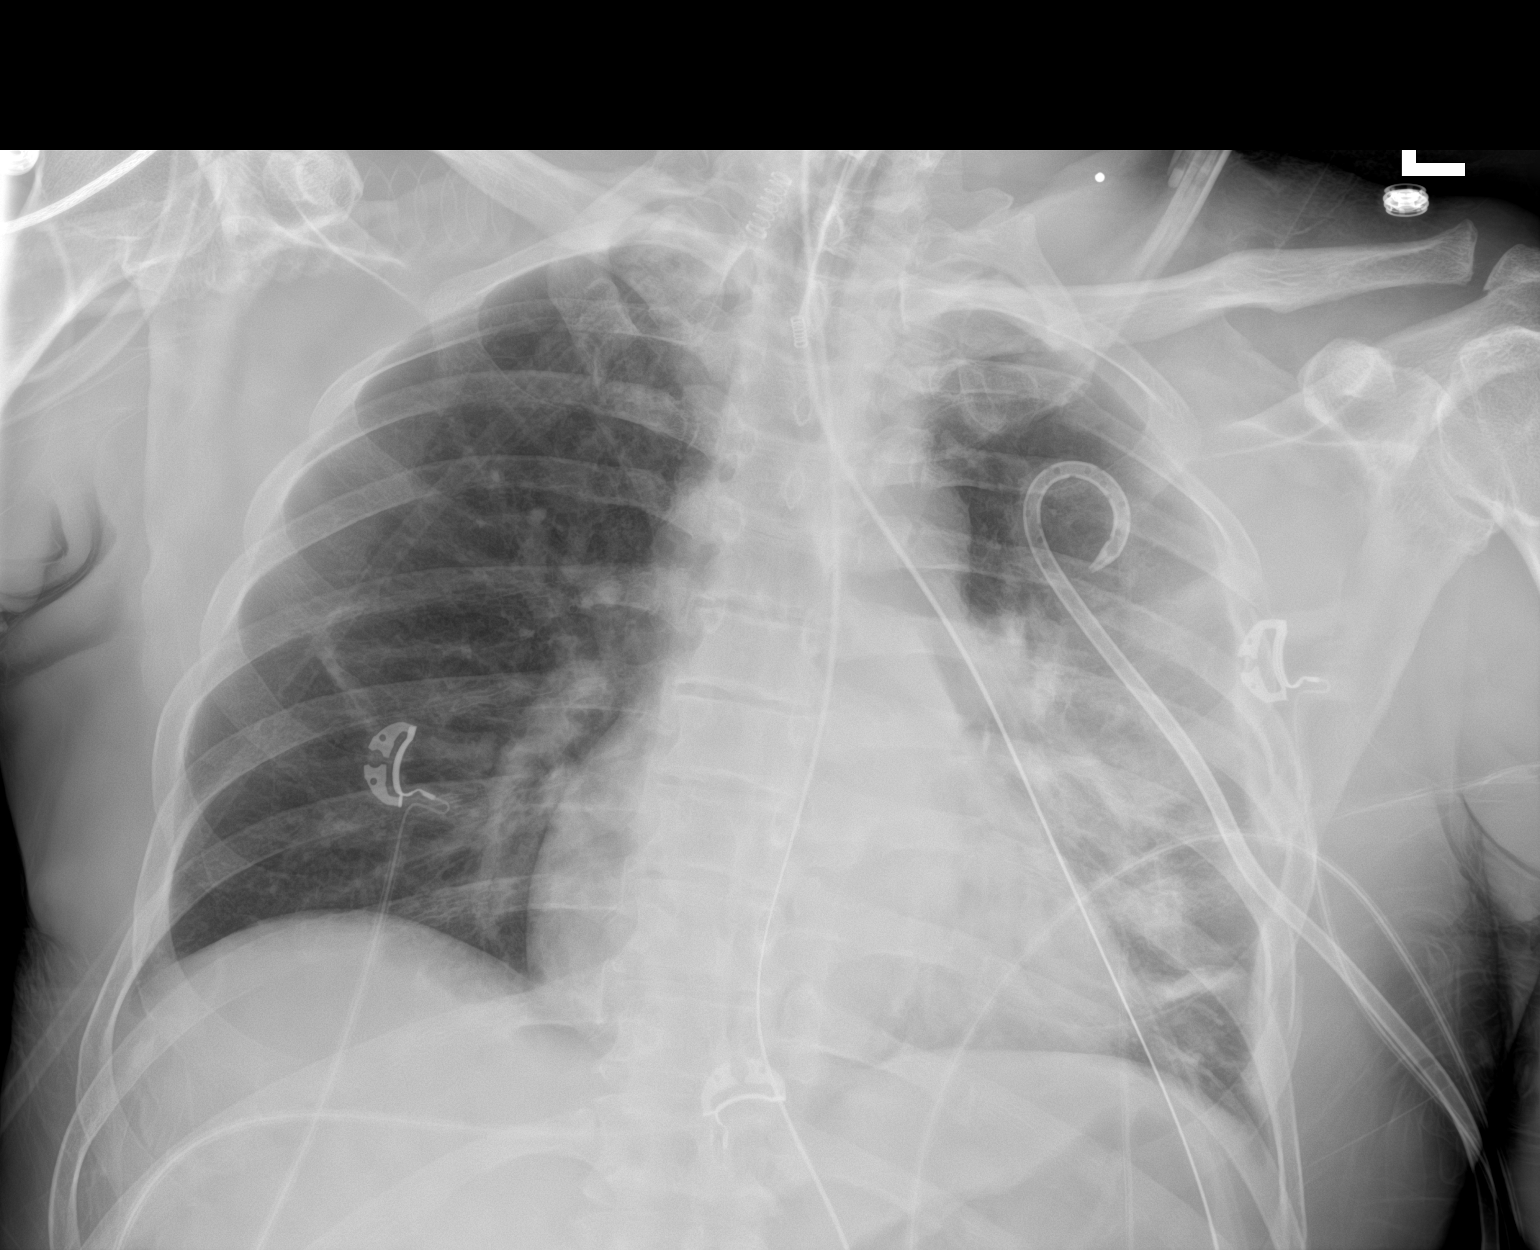

[1 of 1 positions shown; findings below may reference images not displayed]

FINDINGS: Endotracheal tube tip is about 5.5 cm superior to the carina.
Esophageal tube tip below the diaphragm but incompletely included.
Left-sided chest tube with pigtail over left apex. Right lung
grossly clear. No definitive pneumothorax seen. Diffuse airspace
disease in the left thorax increased radiographically. Multiple
left-sided rib fractures.
IMPRESSION: 1. Endotracheal tube tip about 5.5 cm superior to carina
2. Stable positioning of left chest tube. No definitive pneumothorax
seen. Increased airspace disease within the left thorax compared to
prior radiograph likely corresponding to contusion noted on prior CT
imaging. Multiple left rib fractures.

## 2022-10-23 DEATH — deceased
